# Patient Record
Sex: Female | Born: 1975 | Race: White | Hispanic: No | Marital: Married | Smoking: Never smoker
Health system: Southern US, Community
[De-identification: ages and names within clinical notes are randomized; demographics above are authoritative.]

## PROBLEM LIST (undated history)

## (undated) DIAGNOSIS — Z789 Other specified health status: Secondary | ICD-10-CM

## (undated) DIAGNOSIS — M79671 Pain in right foot: Secondary | ICD-10-CM

## (undated) DIAGNOSIS — R635 Abnormal weight gain: Secondary | ICD-10-CM

## (undated) DIAGNOSIS — I10 Essential (primary) hypertension: Secondary | ICD-10-CM

## (undated) DIAGNOSIS — R140 Abdominal distension (gaseous): Secondary | ICD-10-CM

## (undated) DIAGNOSIS — M722 Plantar fascial fibromatosis: Secondary | ICD-10-CM

## (undated) DIAGNOSIS — E669 Obesity, unspecified: Secondary | ICD-10-CM

## (undated) DIAGNOSIS — M79672 Pain in left foot: Secondary | ICD-10-CM

## (undated) HISTORY — DX: Pain in right foot: M79.672

## (undated) HISTORY — PX: APPENDECTOMY: SHX54

## (undated) HISTORY — DX: Abdominal distension (gaseous): R14.0

## (undated) HISTORY — DX: Obesity, unspecified: E66.9

## (undated) HISTORY — DX: Pain in right foot: M79.671

## (undated) HISTORY — DX: Other specified health status: Z78.9

## (undated) HISTORY — DX: Plantar fascial fibromatosis: M72.2

## (undated) HISTORY — PX: PILONIDAL CYST EXCISION: SHX744

## (undated) HISTORY — DX: Abnormal weight gain: R63.5

## (undated) HISTORY — PX: TONSILLECTOMY: SUR1361

---

## 1998-05-16 ENCOUNTER — Other Ambulatory Visit: Admission: RE | Admit: 1998-05-16 | Discharge: 1998-05-16 | Payer: Self-pay | Admitting: Family Medicine

## 1998-07-12 ENCOUNTER — Ambulatory Visit (HOSPITAL_COMMUNITY): Admission: RE | Admit: 1998-07-12 | Discharge: 1998-07-12 | Payer: Self-pay | Admitting: Family Medicine

## 1998-07-16 ENCOUNTER — Inpatient Hospital Stay (HOSPITAL_COMMUNITY): Admission: AD | Admit: 1998-07-16 | Discharge: 1998-07-16 | Payer: Self-pay | Admitting: Family Medicine

## 1998-12-04 ENCOUNTER — Inpatient Hospital Stay (HOSPITAL_COMMUNITY): Admission: AD | Admit: 1998-12-04 | Discharge: 1998-12-06 | Payer: Self-pay | Admitting: Family Medicine

## 1999-01-23 ENCOUNTER — Encounter: Admission: RE | Admit: 1999-01-23 | Discharge: 1999-02-20 | Payer: Self-pay

## 2001-08-03 ENCOUNTER — Other Ambulatory Visit: Admission: RE | Admit: 2001-08-03 | Discharge: 2001-08-03 | Payer: Self-pay | Admitting: Family Medicine

## 2001-09-28 ENCOUNTER — Encounter: Payer: Self-pay | Admitting: Family Medicine

## 2001-09-28 ENCOUNTER — Ambulatory Visit (HOSPITAL_COMMUNITY): Admission: RE | Admit: 2001-09-28 | Discharge: 2001-09-28 | Payer: Self-pay | Admitting: Family Medicine

## 2002-02-22 ENCOUNTER — Inpatient Hospital Stay (HOSPITAL_COMMUNITY): Admission: AD | Admit: 2002-02-22 | Discharge: 2002-02-23 | Payer: Self-pay | Admitting: Family Medicine

## 2002-09-27 ENCOUNTER — Other Ambulatory Visit: Admission: RE | Admit: 2002-09-27 | Discharge: 2002-09-27 | Payer: Self-pay | Admitting: Family Medicine

## 2003-10-05 ENCOUNTER — Other Ambulatory Visit: Admission: RE | Admit: 2003-10-05 | Discharge: 2003-10-05 | Payer: Self-pay | Admitting: Family Medicine

## 2004-10-17 ENCOUNTER — Other Ambulatory Visit: Admission: RE | Admit: 2004-10-17 | Discharge: 2004-10-17 | Payer: Self-pay | Admitting: Family Medicine

## 2006-01-22 ENCOUNTER — Other Ambulatory Visit: Admission: RE | Admit: 2006-01-22 | Discharge: 2006-01-22 | Payer: Self-pay | Admitting: Family Medicine

## 2007-11-02 ENCOUNTER — Other Ambulatory Visit: Admission: RE | Admit: 2007-11-02 | Discharge: 2007-11-02 | Payer: Self-pay | Admitting: Obstetrics and Gynecology

## 2007-12-14 ENCOUNTER — Ambulatory Visit (HOSPITAL_COMMUNITY): Admission: RE | Admit: 2007-12-14 | Discharge: 2007-12-14 | Payer: Self-pay | Admitting: Obstetrics and Gynecology

## 2008-01-05 ENCOUNTER — Ambulatory Visit (HOSPITAL_COMMUNITY): Admission: RE | Admit: 2008-01-05 | Discharge: 2008-01-05 | Payer: Self-pay | Admitting: Obstetrics and Gynecology

## 2008-04-29 ENCOUNTER — Ambulatory Visit: Payer: Self-pay | Admitting: Obstetrics & Gynecology

## 2008-04-29 ENCOUNTER — Inpatient Hospital Stay (HOSPITAL_COMMUNITY): Admission: AD | Admit: 2008-04-29 | Discharge: 2008-05-02 | Payer: Self-pay | Admitting: Obstetrics & Gynecology

## 2011-03-12 ENCOUNTER — Other Ambulatory Visit: Payer: Self-pay | Admitting: Adult Health

## 2011-03-12 ENCOUNTER — Other Ambulatory Visit (HOSPITAL_COMMUNITY)
Admission: RE | Admit: 2011-03-12 | Discharge: 2011-03-12 | Disposition: A | Discharge status: Home / Self Care | Payer: Self-pay | Source: Ambulatory Visit | Attending: Obstetrics and Gynecology | Admitting: Obstetrics and Gynecology

## 2011-03-12 DIAGNOSIS — Z01419 Encounter for gynecological examination (general) (routine) without abnormal findings: Secondary | ICD-10-CM | POA: Insufficient documentation

## 2011-04-07 LAB — CBC
HCT: 41
Hemoglobin: 13.6
MCHC: 33.2
MCV: 90.4
Platelets: 265
RBC: 4.53
RDW: 13.4
WBC: 11.1 — ABNORMAL HIGH

## 2011-04-07 LAB — RH IMMUNE GLOB WKUP(>/=20WKS)(NOT WOMEN'S HOSP): Fetal Screen: POSITIVE

## 2011-04-07 LAB — KLEIHAUER-BETKE STAIN
# Vials RhIg: 2
Fetal Cells %: 0.3
Quantitation Fetal Hemoglobin: 15

## 2011-04-07 LAB — RPR: RPR Ser Ql: NONREACTIVE

## 2012-06-01 ENCOUNTER — Other Ambulatory Visit: Payer: Self-pay | Admitting: Adult Health

## 2012-06-01 ENCOUNTER — Other Ambulatory Visit (HOSPITAL_COMMUNITY)
Admission: RE | Admit: 2012-06-01 | Discharge: 2012-06-01 | Disposition: A | Discharge status: Home / Self Care | Payer: PRIVATE HEALTH INSURANCE | Source: Ambulatory Visit | Attending: Obstetrics and Gynecology | Admitting: Obstetrics and Gynecology

## 2012-06-01 DIAGNOSIS — Z1151 Encounter for screening for human papillomavirus (HPV): Secondary | ICD-10-CM | POA: Insufficient documentation

## 2012-06-01 DIAGNOSIS — Z01419 Encounter for gynecological examination (general) (routine) without abnormal findings: Secondary | ICD-10-CM | POA: Insufficient documentation

## 2012-11-09 ENCOUNTER — Encounter: Payer: Self-pay | Admitting: *Deleted

## 2012-11-10 ENCOUNTER — Encounter: Payer: Self-pay | Admitting: Obstetrics & Gynecology

## 2012-11-10 ENCOUNTER — Ambulatory Visit (INDEPENDENT_AMBULATORY_CARE_PROVIDER_SITE_OTHER): Payer: PRIVATE HEALTH INSURANCE | Admitting: Obstetrics & Gynecology

## 2012-11-10 VITALS — BP 108/78 | Ht 66.0 in | Wt 232.0 lb

## 2012-11-10 DIAGNOSIS — Z3202 Encounter for pregnancy test, result negative: Secondary | ICD-10-CM

## 2012-11-10 DIAGNOSIS — Z309 Encounter for contraceptive management, unspecified: Secondary | ICD-10-CM

## 2012-11-10 DIAGNOSIS — Z3049 Encounter for surveillance of other contraceptives: Secondary | ICD-10-CM

## 2012-11-10 DIAGNOSIS — Z3200 Encounter for pregnancy test, result unknown: Secondary | ICD-10-CM

## 2012-11-10 MED ORDER — MEDROXYPROGESTERONE ACETATE 150 MG/ML IM SUSP: 150.0000 mg | Freq: Once | INTRAMUSCULAR | Status: DC

## 2012-11-10 MED ORDER — MEDROXYPROGESTERONE ACETATE 150 MG/ML IM SUSP
150.0000 mg | Freq: Once | INTRAMUSCULAR | Status: AC
  Administered 2012-11-10: 150 mg via INTRAMUSCULAR

## 2013-02-01 ENCOUNTER — Telehealth: Payer: Self-pay | Admitting: *Deleted

## 2013-02-01 ENCOUNTER — Other Ambulatory Visit: Payer: Self-pay | Admitting: Adult Health

## 2013-02-01 MED ORDER — MEDROXYPROGESTERONE ACETATE 150 MG/ML IM SUSP: 150.0000 mg | INTRAMUSCULAR | Status: DC

## 2013-02-02 ENCOUNTER — Ambulatory Visit (INDEPENDENT_AMBULATORY_CARE_PROVIDER_SITE_OTHER): Payer: PRIVATE HEALTH INSURANCE | Admitting: Women's Health

## 2013-02-02 ENCOUNTER — Encounter: Payer: Self-pay | Admitting: Women's Health

## 2013-02-02 VITALS — BP 108/78 | Ht 65.0 in | Wt 235.0 lb

## 2013-02-02 DIAGNOSIS — Z3202 Encounter for pregnancy test, result negative: Secondary | ICD-10-CM

## 2013-02-02 DIAGNOSIS — Z3049 Encounter for surveillance of other contraceptives: Secondary | ICD-10-CM

## 2013-02-02 DIAGNOSIS — Z309 Encounter for contraceptive management, unspecified: Secondary | ICD-10-CM

## 2013-02-02 MED ORDER — MEDROXYPROGESTERONE ACETATE 150 MG/ML IM SUSP
150.0000 mg | Freq: Once | INTRAMUSCULAR | Status: AC
  Administered 2013-02-02: 150 mg via INTRAMUSCULAR

## 2013-02-02 NOTE — Telephone Encounter (Signed)
Victorino Dike already took care of this rx.

## 2013-04-27 ENCOUNTER — Ambulatory Visit (INDEPENDENT_AMBULATORY_CARE_PROVIDER_SITE_OTHER): Payer: PRIVATE HEALTH INSURANCE | Admitting: Adult Health

## 2013-04-27 ENCOUNTER — Ambulatory Visit: Payer: PRIVATE HEALTH INSURANCE

## 2013-04-27 ENCOUNTER — Encounter: Payer: Self-pay | Admitting: Adult Health

## 2013-04-27 VITALS — BP 102/64 | Ht 66.0 in | Wt 236.0 lb

## 2013-04-27 DIAGNOSIS — Z309 Encounter for contraceptive management, unspecified: Secondary | ICD-10-CM

## 2013-04-27 DIAGNOSIS — Z3049 Encounter for surveillance of other contraceptives: Secondary | ICD-10-CM

## 2013-04-27 DIAGNOSIS — Z3202 Encounter for pregnancy test, result negative: Secondary | ICD-10-CM

## 2013-04-27 MED ORDER — MEDROXYPROGESTERONE ACETATE 150 MG/ML IM SUSP
150.0000 mg | Freq: Once | INTRAMUSCULAR | Status: AC
  Administered 2013-04-27: 150 mg via INTRAMUSCULAR

## 2013-06-08 ENCOUNTER — Encounter: Payer: Self-pay | Admitting: Adult Health

## 2013-06-08 ENCOUNTER — Encounter (INDEPENDENT_AMBULATORY_CARE_PROVIDER_SITE_OTHER): Payer: Self-pay

## 2013-06-08 ENCOUNTER — Ambulatory Visit (INDEPENDENT_AMBULATORY_CARE_PROVIDER_SITE_OTHER): Payer: PRIVATE HEALTH INSURANCE | Admitting: Adult Health

## 2013-06-08 VITALS — BP 110/74 | HR 74 | Ht 63.5 in | Wt 220.0 lb

## 2013-06-08 DIAGNOSIS — M79671 Pain in right foot: Secondary | ICD-10-CM

## 2013-06-08 DIAGNOSIS — Z01419 Encounter for gynecological examination (general) (routine) without abnormal findings: Secondary | ICD-10-CM

## 2013-06-08 DIAGNOSIS — E669 Obesity, unspecified: Secondary | ICD-10-CM

## 2013-06-08 DIAGNOSIS — Z309 Encounter for contraceptive management, unspecified: Secondary | ICD-10-CM

## 2013-06-08 HISTORY — DX: Obesity, unspecified: E66.9

## 2013-06-08 MED ORDER — MEDROXYPROGESTERONE ACETATE 150 MG/ML IM SUSP: 150.0000 mg | INTRAMUSCULAR | Status: DC

## 2013-06-08 NOTE — Patient Instructions (Addendum)
Physical in 1 year Mammogram at 42  Calorie Counting Diet A calorie counting diet requires you to eat the number of calories that are right for you in a day. Calories are the measurement of how much energy you get from the food you eat. Eating the right amount of calories is important for staying at a healthy weight. If you eat too many calories, your body will store them as fat and you may gain weight. If you eat too few calories, you may lose weight. Counting the number of calories you eat during a day will help you know if you are eating the right amount. A Registered Dietitian can determine how many calories you need in a day. The amount of calories needed varies from person to person. If your goal is to lose weight, you will need to eat fewer calories. Losing weight can benefit you if you are overweight or have health problems such as heart disease, high blood pressure, or diabetes. If your goal is to gain weight, you will need to eat more calories. Gaining weight may be necessary if you have a certain health problem that causes your body to need more energy. TIPS Whether you are increasing or decreasing the number of calories you eat during a day, it may be hard to get used to changes in what you eat and drink. The following are tips to help you keep track of the number of calories you eat.  Measure foods at home with measuring cups. This helps you know the amount of food and number of calories you are eating.  Restaurants often serve food in amounts that are larger than 1 serving. While eating out, estimate how many servings of a food you are given. For example, a serving of cooked rice is  cup or about the size of half of a fist. Knowing serving sizes will help you be aware of how much food you are eating at restaurants.  Ask for smaller portion sizes or child-size portions at restaurants.  Plan to eat half of a meal at a restaurant. Take the rest home or share the other half with a  friend.  Read the Nutrition Facts panel on food labels for calorie content and serving size. You can find out how many servings are in a package, the size of a serving, and the number of calories each serving has.  For example, a package might contain 3 cookies. The Nutrition Facts panel on that package says that 1 serving is 1 cookie. Below that, it will say there are 3 servings in the container. The calories section of the Nutrition Facts label says there are 90 calories. This means there are 90 calories in 1 cookie (1 serving). If you eat 1 cookie you have eaten 90 calories. If you eat all 3 cookies, you have eaten 270 calories (3 servings x 90 calories = 270 calories). The list below tells you how big or small some common portion sizes are.  1 oz.........4 stacked dice.  3 oz.........Deck of cards.  1 tsp........Tip of little finger.  1 tbs........Thumb.  2 tbs........Golf ball.   cup.......Half of a fist.  1 cup........A fist. KEEP A FOOD LOG Write down every food item you eat, the amount you eat, and the number of calories in each food you eat during the day. At the end of the day, you can add up the total number of calories you have eaten. It may help to keep a list like the one below.   Find out the calorie information by reading the Nutrition Facts panel on food labels. Breakfast  Bran cereal (1 cup, 110 calories).  Fat-free milk ( cup, 45 calories). Snack  Apple (1 medium, 80 calories). Lunch  Spinach (1 cup, 20 calories).  Tomato ( medium, 20 calories).  Chicken breast strips (3 oz, 165 calories).  Shredded cheddar cheese ( cup, 110 calories).  Light Italian dressing (2 tbs, 60 calories).  Whole-wheat bread (1 slice, 80 calories).  Tub margarine (1 tsp, 35 calories).  Vegetable soup (1 cup, 160 calories). Dinner  Pork chop (3 oz, 190 calories).  Brown rice (1 cup, 215 calories).  Steamed broccoli ( cup, 20 calories).  Strawberries (1  cup, 65  calories).  Whipped cream (1 tbs, 50 calories). Daily Calorie Total: 1425 Document Released: 06/23/2005 Document Revised: 09/15/2011 Document Reviewed: 12/18/2006 ExitCare Patient Information 2014 [employer].  

## 2013-06-08 NOTE — Progress Notes (Signed)
Patient ID: Meghan Ruiz, female   DOB: 01-31-1976, 37 y.o.   MRN: [id number] History of Present Illness: Meghan Ruiz is a 37 year old white female,married in for a physical.She had a normal pap with negative HPV 06/01/12.   Current Medications, Allergies, Past Medical History, Past Surgical History, Family History and Social History were reviewed in [employer] record.     Review of Systems: Patient denies any headaches, blurred vision, shortness of breath, chest pain, abdominal pain, problems with bowel movements, urination, or intercourse. No mood changes, she does have bilateral foot pain due to heel spurs, and has had injection in past. She is going to bariatric clinic to lose weight and has lost 15 lbs. She is happy with the Depo.   Physical Exam:BP 110/74  Pulse 74  Ht 5' 3.5" (1.613 m)  Wt 220 lb (99.791 kg)  BMI 38.36 kg/m2 General:  Well developed, well nourished, no acute distress Skin:  Warm and dry Neck:  Midline trachea, normal thyroid Lungs; Clear to auscultation bilaterally Breast:  No dominant palpable mass, retraction, or nipple discharge Cardiovascular: Regular rate and rhythm Abdomen:  Soft, non tender, no hepatosplenomegaly Pelvic:  External genitalia is normal in appearance.  The vagina is normal in appearance.  The cervix is bulbous.  Uterus is felt to be normal size, shape, and contour.  No   adnexal masses or tenderness noted. Extremities:  No swelling or varicosities noted Psych:  No mood changes, alert and cooperative ,seems happy   Impression: Yearly gyn exam no pap Contraceptive management Obesity Bilateral foot pain due to heel spurs   Plan: Physical in 1 year Mammogram at 42  Refilled depo x 1 year Follow up for depo as scheduled Review handout on weight loss

## 2013-07-20 ENCOUNTER — Ambulatory Visit (INDEPENDENT_AMBULATORY_CARE_PROVIDER_SITE_OTHER): Payer: PRIVATE HEALTH INSURANCE | Admitting: Adult Health

## 2013-07-20 ENCOUNTER — Encounter (INDEPENDENT_AMBULATORY_CARE_PROVIDER_SITE_OTHER): Payer: Self-pay

## 2013-07-20 ENCOUNTER — Encounter: Payer: Self-pay | Admitting: Adult Health

## 2013-07-20 VITALS — BP 110/78 | Ht 65.0 in | Wt 212.0 lb

## 2013-07-20 DIAGNOSIS — Z3202 Encounter for pregnancy test, result negative: Secondary | ICD-10-CM

## 2013-07-20 DIAGNOSIS — Z3049 Encounter for surveillance of other contraceptives: Secondary | ICD-10-CM

## 2013-07-20 DIAGNOSIS — Z309 Encounter for contraceptive management, unspecified: Secondary | ICD-10-CM

## 2013-07-20 LAB — POCT URINE PREGNANCY: PREG TEST UR: NEGATIVE

## 2013-07-20 MED ORDER — MEDROXYPROGESTERONE ACETATE 150 MG/ML IM SUSP
150.0000 mg | Freq: Once | INTRAMUSCULAR | Status: AC
  Administered 2013-07-20: 150 mg via INTRAMUSCULAR

## 2013-07-20 NOTE — Progress Notes (Signed)
Patient ID: Liborio NixonChristy Kesselman, female   DOB: Jan 17, 1976, 38 y.o.   MRN: [id number] Depo provera 150 mg given right deltoid, no complications. Resulted negative pregnancy test. Pt to return in 12 weeks for next injection.

## 2013-10-12 ENCOUNTER — Encounter: Payer: Self-pay | Admitting: Obstetrics and Gynecology

## 2013-10-12 ENCOUNTER — Ambulatory Visit (INDEPENDENT_AMBULATORY_CARE_PROVIDER_SITE_OTHER): Payer: PRIVATE HEALTH INSURANCE | Admitting: Obstetrics and Gynecology

## 2013-10-12 VITALS — BP 102/78 | Ht 64.0 in | Wt 206.0 lb

## 2013-10-12 DIAGNOSIS — Z3202 Encounter for pregnancy test, result negative: Secondary | ICD-10-CM

## 2013-10-12 DIAGNOSIS — Z3049 Encounter for surveillance of other contraceptives: Secondary | ICD-10-CM

## 2013-10-12 DIAGNOSIS — Z309 Encounter for contraceptive management, unspecified: Secondary | ICD-10-CM

## 2013-10-12 LAB — POCT URINE PREGNANCY: PREG TEST UR: NEGATIVE

## 2013-10-12 MED ORDER — MEDROXYPROGESTERONE ACETATE 150 MG/ML IM SUSP
150.0000 mg | Freq: Once | INTRAMUSCULAR | Status: AC
  Administered 2013-10-12: 150 mg via INTRAMUSCULAR

## 2013-10-12 NOTE — Progress Notes (Signed)
Patient ID: Meghan NixonChristy Ingles, female   DOB: 1975-11-13, 38 y.o.   MRN: [id number] Pt here today for Depo Provera injection, pregnancy test resulted negative.  Injection given Lt deltoid IM.  Pt instructed to return in 12 weeks for next injection.

## 2014-01-04 ENCOUNTER — Ambulatory Visit (INDEPENDENT_AMBULATORY_CARE_PROVIDER_SITE_OTHER): Payer: PRIVATE HEALTH INSURANCE | Admitting: Adult Health

## 2014-01-04 ENCOUNTER — Encounter: Payer: Self-pay | Admitting: Adult Health

## 2014-01-04 DIAGNOSIS — Z3049 Encounter for surveillance of other contraceptives: Secondary | ICD-10-CM

## 2014-01-04 DIAGNOSIS — Z3202 Encounter for pregnancy test, result negative: Secondary | ICD-10-CM

## 2014-01-04 DIAGNOSIS — Z3042 Encounter for surveillance of injectable contraceptive: Secondary | ICD-10-CM

## 2014-01-04 LAB — POCT URINE PREGNANCY: Preg Test, Ur: NEGATIVE

## 2014-01-04 MED ORDER — MEDROXYPROGESTERONE ACETATE 150 MG/ML IM SUSP
150.0000 mg | Freq: Once | INTRAMUSCULAR | Status: AC
  Administered 2014-01-04: 150 mg via INTRAMUSCULAR

## 2014-03-28 ENCOUNTER — Ambulatory Visit (INDEPENDENT_AMBULATORY_CARE_PROVIDER_SITE_OTHER): Payer: PRIVATE HEALTH INSURANCE | Admitting: Adult Health

## 2014-03-28 ENCOUNTER — Encounter: Payer: Self-pay | Admitting: Adult Health

## 2014-03-28 DIAGNOSIS — Z3042 Encounter for surveillance of injectable contraceptive: Secondary | ICD-10-CM

## 2014-03-28 DIAGNOSIS — Z3202 Encounter for pregnancy test, result negative: Secondary | ICD-10-CM

## 2014-03-28 DIAGNOSIS — Z3049 Encounter for surveillance of other contraceptives: Secondary | ICD-10-CM

## 2014-03-28 LAB — POCT URINE PREGNANCY: Preg Test, Ur: NEGATIVE

## 2014-03-28 MED ORDER — MEDROXYPROGESTERONE ACETATE 150 MG/ML IM SUSP
150.0000 mg | Freq: Once | INTRAMUSCULAR | Status: AC
  Administered 2014-03-28: 150 mg via INTRAMUSCULAR

## 2014-03-29 ENCOUNTER — Ambulatory Visit: Payer: PRIVATE HEALTH INSURANCE

## 2014-05-08 ENCOUNTER — Encounter: Payer: Self-pay | Admitting: Adult Health

## 2014-06-14 ENCOUNTER — Encounter: Payer: Self-pay | Admitting: Adult Health

## 2014-06-14 ENCOUNTER — Ambulatory Visit (INDEPENDENT_AMBULATORY_CARE_PROVIDER_SITE_OTHER): Payer: PRIVATE HEALTH INSURANCE | Admitting: Adult Health

## 2014-06-14 VITALS — BP 110/80 | HR 76 | Ht 64.25 in | Wt 237.0 lb

## 2014-06-14 DIAGNOSIS — R635 Abnormal weight gain: Secondary | ICD-10-CM

## 2014-06-14 DIAGNOSIS — Z01419 Encounter for gynecological examination (general) (routine) without abnormal findings: Secondary | ICD-10-CM

## 2014-06-14 HISTORY — DX: Abnormal weight gain: R63.5

## 2014-06-14 LAB — CBC
HCT: 42.1 % (ref 36.0–46.0)
Hemoglobin: 14.3 g/dL (ref 12.0–15.0)
MCH: 28.1 pg (ref 26.0–34.0)
MCHC: 34 g/dL (ref 30.0–36.0)
MCV: 82.9 fL (ref 78.0–100.0)
MPV: 9.6 fL (ref 9.4–12.4)
PLATELETS: 226 10*3/uL (ref 150–400)
RBC: 5.08 MIL/uL (ref 3.87–5.11)
RDW: 12.6 % (ref 11.5–15.5)
WBC: 5.7 10*3/uL (ref 4.0–10.5)

## 2014-06-14 LAB — TSH: TSH: 1.511 u[IU]/mL (ref 0.350–4.500)

## 2014-06-14 NOTE — Patient Instructions (Signed)
Levonorgestrel intrauterine device (IUD) What is this medicine? LEVONORGESTREL IUD (LEE voe nor jes trel) is a contraceptive (birth control) device. The device is placed inside the uterus by a healthcare professional. It is used to prevent pregnancy and can also be used to treat heavy bleeding that occurs during your period. Depending on the device, it can be used for 3 to 5 years. This medicine may be used for other purposes; ask your health care provider or pharmacist if you have questions. COMMON BRAND NAME(S): LILETTA, Mirena, Skyla What should I tell my health care provider before I take this medicine? They need to know if you have any of these conditions: -abnormal Pap smear -cancer of the breast, uterus, or cervix -diabetes -endometritis -genital or pelvic infection now or in the past -have more than one sexual partner or your partner has more than one partner -heart disease -history of an ectopic or tubal pregnancy -immune system problems -IUD in place -liver disease or tumor -problems with blood clots or take blood-thinners -use intravenous drugs -uterus of unusual shape -vaginal bleeding that has not been explained -an unusual or allergic reaction to levonorgestrel, other hormones, silicone, or polyethylene, medicines, foods, dyes, or preservatives -pregnant or trying to get pregnant -breast-feeding How should I use this medicine? This device is placed inside the uterus by a health care professional. Talk to your pediatrician regarding the use of this medicine in children. Special care may be needed. Overdosage: If you think you have taken too much of this medicine contact a poison control center or emergency room at once. NOTE: This medicine is only for you. Do not share this medicine with others. What if I miss a dose? This does not apply. What may interact with this medicine? Do not take this medicine with any of the following  medications: -amprenavir -bosentan -fosamprenavir This medicine may also interact with the following medications: -aprepitant -barbiturate medicines for inducing sleep or treating seizures -bexarotene -griseofulvin -medicines to treat seizures like carbamazepine, ethotoin, felbamate, oxcarbazepine, phenytoin, topiramate -modafinil -pioglitazone -rifabutin -rifampin -rifapentine -some medicines to treat HIV infection like atazanavir, indinavir, lopinavir, nelfinavir, tipranavir, ritonavir -St. John's wort -warfarin This list may not describe all possible interactions. Give your health care provider a list of all the medicines, herbs, non-prescription drugs, or dietary supplements you use. Also tell them if you smoke, drink alcohol, or use illegal drugs. Some items may interact with your medicine. What should I watch for while using this medicine? Visit your doctor or health care professional for regular check ups. See your doctor if you or your partner has sexual contact with others, becomes HIV positive, or gets a sexual transmitted disease. This product does not protect you against HIV infection (AIDS) or other sexually transmitted diseases. You can check the placement of the IUD yourself by reaching up to the top of your vagina with clean fingers to feel the threads. Do not pull on the threads. It is a good habit to check placement after each menstrual period. Call your doctor right away if you feel more of the IUD than just the threads or if you cannot feel the threads at all. The IUD may come out by itself. You may become pregnant if the device comes out. If you notice that the IUD has come out use a backup birth control method like condoms and call your health care provider. Using tampons will not change the position of the IUD and are okay to use during your period. What side effects may   I notice from receiving this medicine? Side effects that you should report to your doctor or  health care professional as soon as possible: -allergic reactions like skin rash, itching or hives, swelling of the face, lips, or tongue -fever, flu-like symptoms -genital sores -high blood pressure -no menstrual period for 6 weeks during use -pain, swelling, warmth in the leg -pelvic pain or tenderness -severe or sudden headache -signs of pregnancy -stomach cramping -sudden shortness of breath -trouble with balance, talking, or walking -unusual vaginal bleeding, discharge -yellowing of the eyes or skin Side effects that usually do not require medical attention (report to your doctor or health care professional if they continue or are bothersome): -acne -breast pain -change in sex drive or performance -changes in weight -cramping, dizziness, or faintness while the device is being inserted -headache -irregular menstrual bleeding within first 3 to 6 months of use -nausea This list may not describe all possible side effects. Call your doctor for medical advice about side effects. You may report side effects to FDA at 1-800-FDA-1088. Where should I keep my medicine? This does not apply. NOTE: This sheet is a summary. It may not cover all possible information. If you have questions about this medicine, talk to your doctor, pharmacist, or health care provider.  2015, Elsevier/Gold Standard. (2011-07-24 13:54:04) Pap and physical in 1 year Mammogram at 42  Return next week for depo

## 2014-06-14 NOTE — Progress Notes (Signed)
Patient ID: Meghan Ruiz, female   DOB: 08-12-1975, 38 y.o.   MRN: [id number] History of Present Illness: Meghan BonitoChristy is a 38 year old white female, married in for gyn exam and complains of weight gain.She had a normal pap with negative HPV 06/01/12.She has gained about 30 lbs this year. She is on depo and thinking about IUD.   Current Medications, Allergies, Past Medical History, Past Surgical History, Family History and Social History were reviewed in [facility] electronic medical record.     Review of Systems: Patient denies any headaches, blurred vision, shortness of breath, chest pain, abdominal pain, problems with bowel movements, urination, or intercourse. No joint pain or swelling or mood swings.See HPI.    Physical Exam:BP 110/80 mmHg  Pulse 76  Ht 5' 4.25" (1.632 m)  Wt 237 lb (107.502 kg)  BMI 40.36 kg/m2 General:  Well developed, well nourished, no acute distress Skin:  Warm and dry, has increased hair growth Neck:  Midline trachea, normal thyroid Lungs; Clear to auscultation bilaterally Breast:  No dominant palpable mass, retraction, or nipple discharge Cardiovascular: Regular rate and rhythm Abdomen:  Soft, non tender, no hepatosplenomegaly Pelvic:  External genitalia is normal in appearance.  The vagina is normal in appearance.   The cervix is bulbous.  Uterus is felt to be normal size, shape, and contour.  No  adnexal masses or tenderness noted. Extremities:  No swelling or varicosities noted Psych:  No mood changes,alert and cooperative,seems happy   Impression: Well woman gyn exam no pap Weight gain    Plan: Check CBC,CMP,TSH and lipids, will talk when labs back Pap and physical in 1 year Mammogram at 42 Return next week for depo as scheduled Review handout on IUD and talk with husband

## 2014-06-15 LAB — COMPREHENSIVE METABOLIC PANEL
ALT: 25 U/L (ref 0–35)
AST: 17 U/L (ref 0–37)
Albumin: 4.2 g/dL (ref 3.5–5.2)
Alkaline Phosphatase: 61 U/L (ref 39–117)
BUN: 12 mg/dL (ref 6–23)
CALCIUM: 9.2 mg/dL (ref 8.4–10.5)
CHLORIDE: 105 meq/L (ref 96–112)
CO2: 24 mEq/L (ref 19–32)
CREATININE: 0.68 mg/dL (ref 0.50–1.10)
Glucose, Bld: 92 mg/dL (ref 70–99)
Potassium: 3.8 mEq/L (ref 3.5–5.3)
Sodium: 138 mEq/L (ref 135–145)
Total Bilirubin: 1.6 mg/dL — ABNORMAL HIGH (ref 0.2–1.2)
Total Protein: 6.5 g/dL (ref 6.0–8.3)

## 2014-06-15 LAB — LIPID PANEL
Cholesterol: 138 mg/dL (ref 0–200)
HDL: 43 mg/dL (ref >39)
LDL CALC: 78 mg/dL (ref 0–99)
TRIGLYCERIDES: 86 mg/dL (ref <150)
Total CHOL/HDL Ratio: 3.2 Ratio
VLDL: 17 mg/dL (ref 0–40)

## 2014-06-19 ENCOUNTER — Other Ambulatory Visit: Payer: Self-pay | Admitting: Family Medicine

## 2014-06-19 NOTE — Telephone Encounter (Signed)
Pt aware labs good 

## 2014-06-20 ENCOUNTER — Ambulatory Visit (INDEPENDENT_AMBULATORY_CARE_PROVIDER_SITE_OTHER): Payer: PRIVATE HEALTH INSURANCE | Admitting: Adult Health

## 2014-06-20 ENCOUNTER — Encounter: Payer: Self-pay | Admitting: Adult Health

## 2014-06-20 DIAGNOSIS — Z3042 Encounter for surveillance of injectable contraceptive: Secondary | ICD-10-CM

## 2014-06-20 DIAGNOSIS — Z3202 Encounter for pregnancy test, result negative: Secondary | ICD-10-CM

## 2014-06-20 LAB — POCT URINE PREGNANCY: Preg Test, Ur: NEGATIVE

## 2014-06-20 MED ORDER — MEDROXYPROGESTERONE ACETATE 150 MG/ML IM SUSP
150.0000 mg | Freq: Once | INTRAMUSCULAR | Status: AC
  Administered 2014-06-20: 150 mg via INTRAMUSCULAR

## 2014-09-11 ENCOUNTER — Other Ambulatory Visit: Payer: Self-pay | Admitting: *Deleted

## 2014-09-11 MED ORDER — MEDROXYPROGESTERONE ACETATE 150 MG/ML IM SUSP: 150.0000 mg | INTRAMUSCULAR | Status: DC

## 2014-09-12 ENCOUNTER — Encounter: Payer: Self-pay | Admitting: *Deleted

## 2014-09-12 ENCOUNTER — Ambulatory Visit (INDEPENDENT_AMBULATORY_CARE_PROVIDER_SITE_OTHER): Payer: PRIVATE HEALTH INSURANCE | Admitting: *Deleted

## 2014-09-12 DIAGNOSIS — Z3042 Encounter for surveillance of injectable contraceptive: Secondary | ICD-10-CM

## 2014-09-12 DIAGNOSIS — Z3202 Encounter for pregnancy test, result negative: Secondary | ICD-10-CM

## 2014-09-12 LAB — POCT URINE PREGNANCY: Preg Test, Ur: NEGATIVE

## 2014-09-12 MED ORDER — MEDROXYPROGESTERONE ACETATE 150 MG/ML IM SUSP
150.0000 mg | Freq: Once | INTRAMUSCULAR | Status: AC
  Administered 2014-09-12: 150 mg via INTRAMUSCULAR

## 2014-09-12 NOTE — Progress Notes (Signed)
Pt here for Depo. Reports having a head cold at this time. Has tried Mucinex in the past, will try again. Advised to let us know if no better. Pt voiced understanding.  Return in 12 weeks for next shot. JSY

## 2014-12-05 ENCOUNTER — Encounter: Payer: Self-pay | Admitting: *Deleted

## 2014-12-05 ENCOUNTER — Ambulatory Visit (INDEPENDENT_AMBULATORY_CARE_PROVIDER_SITE_OTHER): Payer: BLUE CROSS/BLUE SHIELD | Admitting: *Deleted

## 2014-12-05 DIAGNOSIS — Z3202 Encounter for pregnancy test, result negative: Secondary | ICD-10-CM

## 2014-12-05 DIAGNOSIS — Z3042 Encounter for surveillance of injectable contraceptive: Secondary | ICD-10-CM

## 2014-12-05 LAB — POCT URINE PREGNANCY: PREG TEST UR: NEGATIVE

## 2014-12-05 MED ORDER — MEDROXYPROGESTERONE ACETATE 150 MG/ML IM SUSP
150.0000 mg | Freq: Once | INTRAMUSCULAR | Status: AC
  Administered 2014-12-05: 150 mg via INTRAMUSCULAR

## 2014-12-05 NOTE — Progress Notes (Signed)
Pt here for Depo. Reports no problems at this time. Return in 12 weeks for next shot. JSY 

## 2015-02-23 ENCOUNTER — Ambulatory Visit (INDEPENDENT_AMBULATORY_CARE_PROVIDER_SITE_OTHER): Payer: BLUE CROSS/BLUE SHIELD | Admitting: *Deleted

## 2015-02-23 ENCOUNTER — Encounter: Payer: Self-pay | Admitting: *Deleted

## 2015-02-23 DIAGNOSIS — Z3042 Encounter for surveillance of injectable contraceptive: Secondary | ICD-10-CM | POA: Diagnosis not present

## 2015-02-23 DIAGNOSIS — Z3202 Encounter for pregnancy test, result negative: Secondary | ICD-10-CM | POA: Diagnosis not present

## 2015-02-23 LAB — POCT URINE PREGNANCY: Preg Test, Ur: NEGATIVE

## 2015-02-23 MED ORDER — MEDROXYPROGESTERONE ACETATE 150 MG/ML IM SUSP
150.0000 mg | Freq: Once | INTRAMUSCULAR | Status: AC
  Administered 2015-02-23: 150 mg via INTRAMUSCULAR

## 2015-02-23 NOTE — Progress Notes (Signed)
Pt here for Depo. Reports no problems at this time. Return in 12 weeks for next shot. JSY 

## 2015-02-27 ENCOUNTER — Ambulatory Visit: Payer: BLUE CROSS/BLUE SHIELD

## 2015-05-21 ENCOUNTER — Encounter: Payer: Self-pay | Admitting: *Deleted

## 2015-05-21 ENCOUNTER — Ambulatory Visit (INDEPENDENT_AMBULATORY_CARE_PROVIDER_SITE_OTHER): Payer: BLUE CROSS/BLUE SHIELD | Admitting: *Deleted

## 2015-05-21 DIAGNOSIS — Z3042 Encounter for surveillance of injectable contraceptive: Secondary | ICD-10-CM

## 2015-05-21 DIAGNOSIS — Z3202 Encounter for pregnancy test, result negative: Secondary | ICD-10-CM | POA: Diagnosis not present

## 2015-05-21 LAB — POCT URINE PREGNANCY: Preg Test, Ur: NEGATIVE

## 2015-05-21 MED ORDER — MEDROXYPROGESTERONE ACETATE 150 MG/ML IM SUSP
150.0000 mg | Freq: Once | INTRAMUSCULAR | Status: AC
  Administered 2015-05-21: 150 mg via INTRAMUSCULAR

## 2015-05-21 NOTE — Progress Notes (Signed)
Pt here for Depo. Reports no problems at this time. Return in 12 weeks for next shot. JSY 

## 2015-06-18 ENCOUNTER — Other Ambulatory Visit: Payer: BLUE CROSS/BLUE SHIELD | Admitting: Adult Health

## 2015-07-11 ENCOUNTER — Encounter: Payer: Self-pay | Admitting: Adult Health

## 2015-07-11 ENCOUNTER — Ambulatory Visit (INDEPENDENT_AMBULATORY_CARE_PROVIDER_SITE_OTHER): Payer: BLUE CROSS/BLUE SHIELD | Admitting: Adult Health

## 2015-07-11 ENCOUNTER — Other Ambulatory Visit (HOSPITAL_COMMUNITY)
Admission: RE | Admit: 2015-07-11 | Discharge: 2015-07-11 | Disposition: A | Discharge status: Home / Self Care | Payer: BLUE CROSS/BLUE SHIELD | Source: Ambulatory Visit | Attending: Adult Health | Admitting: Adult Health

## 2015-07-11 VITALS — BP 120/80 | HR 92 | Ht 64.0 in | Wt 243.0 lb

## 2015-07-11 DIAGNOSIS — Z01419 Encounter for gynecological examination (general) (routine) without abnormal findings: Secondary | ICD-10-CM | POA: Diagnosis not present

## 2015-07-11 DIAGNOSIS — Z1151 Encounter for screening for human papillomavirus (HPV): Secondary | ICD-10-CM | POA: Diagnosis not present

## 2015-07-11 DIAGNOSIS — E669 Obesity, unspecified: Secondary | ICD-10-CM

## 2015-07-11 DIAGNOSIS — R140 Abdominal distension (gaseous): Secondary | ICD-10-CM

## 2015-07-11 HISTORY — DX: Abdominal distension (gaseous): R14.0

## 2015-07-11 NOTE — Progress Notes (Signed)
Patient ID: Meghan Ruiz Camba, female   DOB: 11-14-1975, 40 y.o.   MRN: [id number] History of Present Illness: Meghan Ruiz is a 40 year old white female, married in for a well woman gyn exam and pap, she is complaining of bloating esp in evenings, no pain.She said she is going to stop depo and she if she can lose some weight, and she requests labs today.Has pain in feet at times from heel spurs.   Current Medications, Allergies, Past Medical History, Past Surgical History, Family History and Social History were reviewed in [facility] electronic medical record.     Review of Systems: Patient denies any headaches, hearing loss, fatigue, blurred vision, shortness of breath, chest pain, abdominal pain, problems with bowel movements, urination, or intercourse. No joint pain or mood swings.She HPI for positives.    Physical Exam:BP 120/80 mmHg  Pulse 92  Ht 5\' 4"  (1.626 m)  Wt 243 lb (110.224 kg)  BMI 41.69 kg/m2 General:  Well developed, well nourished, no acute distress Skin:  Warm and dry Neck:  Midline trachea, normal thyroid, good ROM, no lymphadenopathy Lungs; Clear to auscultation bilaterally Breast:  No dominant palpable mass, retraction, or nipple discharge Cardiovascular: Regular rate and rhythm Abdomen:  Soft, non tender, no hepatosplenomegaly,obese Pelvic:  External genitalia is normal in appearance, no lesions.  The vagina is normal in appearance. Urethra has no lesions or masses. The cervix is bulbous. Pap with HPV performed. Uterus is felt to be ULNS size, but has increased adipose tissue in this area, so difficult to feel.   No adnexal masses or tenderness noted.Bladder is non tender, no masses felt. Extremities/musculoskeletal:  No swelling or varicosities noted, no clubbing or cyanosis Psych:  No mood changes, alert and cooperative,seems happy   Impression: Well woman gyn exam and pap Abdominal bloating Obesity     Plan: Gyn US in 6 days to assess uterus Check  CBC,CMP,TSH and lipids Mammogram at 42  Physical in 1 year, pap in 3 if normal Will talk when US and labs back  Use condoms when stops depo

## 2015-07-11 NOTE — Patient Instructions (Signed)
Physical  in 1 year Return in 6 days for gyn US Mammogram 42  Use condoms

## 2015-07-12 LAB — CBC
Hematocrit: 43.2 % (ref 34.0–46.6)
Hemoglobin: 14.7 g/dL (ref 11.1–15.9)
MCH: 28.4 pg (ref 26.6–33.0)
MCHC: 34 g/dL (ref 31.5–35.7)
MCV: 84 fL (ref 79–97)
PLATELETS: 236 10*3/uL (ref 150–379)
RBC: 5.17 x10E6/uL (ref 3.77–5.28)
RDW: 13.3 % (ref 12.3–15.4)
WBC: 8.6 10*3/uL (ref 3.4–10.8)

## 2015-07-12 LAB — COMPREHENSIVE METABOLIC PANEL
A/G RATIO: 1.8 (ref 1.1–2.5)
ALT: 45 IU/L — AB (ref 0–32)
AST: 28 IU/L (ref 0–40)
Albumin: 4.4 g/dL (ref 3.5–5.5)
Alkaline Phosphatase: 75 IU/L (ref 39–117)
BILIRUBIN TOTAL: 1.4 mg/dL — AB (ref 0.0–1.2)
BUN/Creatinine Ratio: 14 (ref 8–20)
BUN: 11 mg/dL (ref 6–20)
CALCIUM: 9.4 mg/dL (ref 8.7–10.2)
CHLORIDE: 105 mmol/L (ref 96–106)
CO2: 21 mmol/L (ref 18–29)
Creatinine, Ser: 0.76 mg/dL (ref 0.57–1.00)
GFR calc Af Amer: 116 mL/min/{1.73_m2} (ref >59)
GFR calc non Af Amer: 101 mL/min/{1.73_m2} (ref >59)
Globulin, Total: 2.4 g/dL (ref 1.5–4.5)
Glucose: 90 mg/dL (ref 65–99)
POTASSIUM: 4 mmol/L (ref 3.5–5.2)
Sodium: 143 mmol/L (ref 134–144)
Total Protein: 6.8 g/dL (ref 6.0–8.5)

## 2015-07-12 LAB — LIPID PANEL
CHOLESTEROL TOTAL: 160 mg/dL (ref 100–199)
Chol/HDL Ratio: 3.7 ratio units (ref 0.0–4.4)
HDL: 43 mg/dL (ref >39)
LDL Calculated: 102 mg/dL — ABNORMAL HIGH (ref 0–99)
TRIGLYCERIDES: 73 mg/dL (ref 0–149)
VLDL Cholesterol Cal: 15 mg/dL (ref 5–40)

## 2015-07-12 LAB — TSH: TSH: 1.98 u[IU]/mL (ref 0.450–4.500)

## 2015-07-16 ENCOUNTER — Telehealth: Payer: Self-pay | Admitting: Adult Health

## 2015-07-16 LAB — CYTOLOGY - PAP

## 2015-07-16 NOTE — Telephone Encounter (Signed)
Pt aware labs good, and the ALT slightly elevated

## 2015-07-17 ENCOUNTER — Telehealth: Payer: Self-pay | Admitting: Adult Health

## 2015-07-17 ENCOUNTER — Ambulatory Visit (INDEPENDENT_AMBULATORY_CARE_PROVIDER_SITE_OTHER): Payer: BLUE CROSS/BLUE SHIELD

## 2015-07-17 DIAGNOSIS — R140 Abdominal distension (gaseous): Secondary | ICD-10-CM

## 2015-07-17 DIAGNOSIS — N854 Malposition of uterus: Secondary | ICD-10-CM

## 2015-07-17 NOTE — Progress Notes (Signed)
PELVIC US TA/TV: homogenous anteverted uterus,normal ov's bilat (mobile),EEC 3.596mm,no free fluid seen,lt adnexal pain during ultrasound

## 2015-07-17 NOTE — Telephone Encounter (Signed)
Pt aware US was normal 

## 2015-08-13 ENCOUNTER — Ambulatory Visit (INDEPENDENT_AMBULATORY_CARE_PROVIDER_SITE_OTHER): Payer: BLUE CROSS/BLUE SHIELD | Admitting: *Deleted

## 2015-08-13 ENCOUNTER — Encounter: Payer: Self-pay | Admitting: *Deleted

## 2015-08-13 DIAGNOSIS — Z3042 Encounter for surveillance of injectable contraceptive: Secondary | ICD-10-CM | POA: Diagnosis not present

## 2015-08-13 DIAGNOSIS — Z3202 Encounter for pregnancy test, result negative: Secondary | ICD-10-CM

## 2015-08-13 LAB — POCT URINE PREGNANCY: PREG TEST UR: NEGATIVE

## 2015-08-13 MED ORDER — MEDROXYPROGESTERONE ACETATE 150 MG/ML IM SUSP
150.0000 mg | Freq: Once | INTRAMUSCULAR | Status: AC
  Administered 2015-08-13: 150 mg via INTRAMUSCULAR

## 2015-08-13 NOTE — Progress Notes (Signed)
Patient ID: Meghan Ruiz, female   DOB: [birth year], 40 y.o.   MRN: [id number] Depo Provera 150 mg IM given left deltoid with no complications, negative pregnancy test. Pt to return in 12 weeks for next injection.

## 2015-11-05 ENCOUNTER — Encounter: Payer: Self-pay | Admitting: *Deleted

## 2015-11-05 ENCOUNTER — Ambulatory Visit (INDEPENDENT_AMBULATORY_CARE_PROVIDER_SITE_OTHER): Payer: BLUE CROSS/BLUE SHIELD | Admitting: *Deleted

## 2015-11-05 DIAGNOSIS — Z3042 Encounter for surveillance of injectable contraceptive: Secondary | ICD-10-CM

## 2015-11-05 DIAGNOSIS — Z3202 Encounter for pregnancy test, result negative: Secondary | ICD-10-CM | POA: Diagnosis not present

## 2015-11-05 LAB — POCT URINE PREGNANCY: Preg Test, Ur: NEGATIVE

## 2015-11-05 MED ORDER — MEDROXYPROGESTERONE ACETATE 150 MG/ML IM SUSP
150.0000 mg | Freq: Once | INTRAMUSCULAR | Status: AC
  Administered 2015-11-05: 150 mg via INTRAMUSCULAR

## 2015-11-05 NOTE — Progress Notes (Signed)
Pt here for Depo. Pt tolerated shot well. Return in 12 weeks for next shot. JSY 

## 2016-01-28 ENCOUNTER — Encounter: Payer: Self-pay | Admitting: *Deleted

## 2016-01-28 ENCOUNTER — Ambulatory Visit (INDEPENDENT_AMBULATORY_CARE_PROVIDER_SITE_OTHER): Payer: BLUE CROSS/BLUE SHIELD | Admitting: *Deleted

## 2016-01-28 DIAGNOSIS — Z3042 Encounter for surveillance of injectable contraceptive: Secondary | ICD-10-CM

## 2016-01-28 DIAGNOSIS — Z3202 Encounter for pregnancy test, result negative: Secondary | ICD-10-CM | POA: Diagnosis not present

## 2016-01-28 LAB — POCT URINE PREGNANCY: Preg Test, Ur: NEGATIVE

## 2016-01-28 MED ORDER — MEDROXYPROGESTERONE ACETATE 150 MG/ML IM SUSP
150.0000 mg | Freq: Once | INTRAMUSCULAR | Status: AC
  Administered 2016-01-28: 150 mg via INTRAMUSCULAR

## 2016-01-28 NOTE — Progress Notes (Signed)
Pt given DEPO injection and tolerated well.  

## 2016-04-17 ENCOUNTER — Other Ambulatory Visit: Payer: Self-pay | Admitting: Adult Health

## 2016-04-21 ENCOUNTER — Encounter: Payer: Self-pay | Admitting: *Deleted

## 2016-04-21 ENCOUNTER — Ambulatory Visit (INDEPENDENT_AMBULATORY_CARE_PROVIDER_SITE_OTHER): Payer: BLUE CROSS/BLUE SHIELD | Admitting: *Deleted

## 2016-04-21 DIAGNOSIS — Z3042 Encounter for surveillance of injectable contraceptive: Secondary | ICD-10-CM | POA: Diagnosis not present

## 2016-04-21 DIAGNOSIS — Z308 Encounter for other contraceptive management: Secondary | ICD-10-CM

## 2016-04-21 DIAGNOSIS — Z3202 Encounter for pregnancy test, result negative: Secondary | ICD-10-CM | POA: Diagnosis not present

## 2016-04-21 LAB — POCT URINE PREGNANCY: PREG TEST UR: NEGATIVE

## 2016-04-21 MED ORDER — MEDROXYPROGESTERONE ACETATE 150 MG/ML IM SUSP
150.0000 mg | Freq: Once | INTRAMUSCULAR | Status: AC
  Administered 2016-04-21: 150 mg via INTRAMUSCULAR

## 2016-04-21 NOTE — Progress Notes (Signed)
Pt here for Depo. Pt tolerated shot well. Return in 12 weeks for next shot. JSY 

## 2016-07-23 ENCOUNTER — Ambulatory Visit: Payer: BLUE CROSS/BLUE SHIELD

## 2016-07-25 ENCOUNTER — Encounter: Payer: Self-pay | Admitting: *Deleted

## 2016-07-25 ENCOUNTER — Ambulatory Visit (INDEPENDENT_AMBULATORY_CARE_PROVIDER_SITE_OTHER): Payer: BLUE CROSS/BLUE SHIELD | Admitting: *Deleted

## 2016-07-25 DIAGNOSIS — Z3202 Encounter for pregnancy test, result negative: Secondary | ICD-10-CM

## 2016-07-25 DIAGNOSIS — Z3042 Encounter for surveillance of injectable contraceptive: Secondary | ICD-10-CM | POA: Diagnosis not present

## 2016-07-25 DIAGNOSIS — Z308 Encounter for other contraceptive management: Secondary | ICD-10-CM

## 2016-07-25 LAB — POCT URINE PREGNANCY: Preg Test, Ur: NEGATIVE

## 2016-07-25 MED ORDER — MEDROXYPROGESTERONE ACETATE 150 MG/ML IM SUSP
150.0000 mg | Freq: Once | INTRAMUSCULAR | Status: AC
  Administered 2016-07-25: 150 mg via INTRAMUSCULAR

## 2016-07-25 NOTE — Progress Notes (Signed)
Pt here for Depo. Pt is 4 days late getting shot. Pt last had sex 07/21/16. I spoke with JAG and she advised ok to give shot as long as UPT is negative. UPT was negative. Pt tolerated shot well. Return in 12 weeks for next shot. JSY

## 2016-08-06 ENCOUNTER — Encounter: Payer: Self-pay | Admitting: Adult Health

## 2016-08-06 ENCOUNTER — Ambulatory Visit (INDEPENDENT_AMBULATORY_CARE_PROVIDER_SITE_OTHER): Payer: BLUE CROSS/BLUE SHIELD | Admitting: Adult Health

## 2016-08-06 VITALS — BP 126/80 | HR 78 | Ht 64.25 in | Wt 240.5 lb

## 2016-08-06 DIAGNOSIS — Z3042 Encounter for surveillance of injectable contraceptive: Secondary | ICD-10-CM

## 2016-08-06 DIAGNOSIS — R609 Edema, unspecified: Secondary | ICD-10-CM

## 2016-08-06 DIAGNOSIS — Z01419 Encounter for gynecological examination (general) (routine) without abnormal findings: Secondary | ICD-10-CM | POA: Diagnosis not present

## 2016-08-06 MED ORDER — MEDROXYPROGESTERONE ACETATE 150 MG/ML IM SUSP: INTRAMUSCULAR | 4 refills | Status: DC

## 2016-08-06 MED ORDER — HYDROCHLOROTHIAZIDE 25 MG PO TABS: ORAL_TABLET | ORAL | 1 refills | Status: DC

## 2016-08-06 NOTE — Progress Notes (Signed)
Patient ID: Meghan Ruiz, female   DOB: 1976-05-29, 41 y.o.   MRN: [id number] History of Present Illness: Meghan Ruiz is a 41 year old white female, married in for well woman gyn exam, she had a normal pap with negative HPV 07/11/15. She is happy with depo, "husband won't go get fixed".    Current Medications, Allergies, Past Medical History, Past Surgical History, Family History and Social History were reviewed in [facility] electronic medical record.     Review of Systems: Patient denies any headaches, hearing loss, fatigue, blurred vision, shortness of breath, chest pain, abdominal pain, problems with bowel movements, urination, or intercourse. No joint pain or mood swings.+swelling in fingers and legs,but works 14 hours in [employer] [place] [employer]     Physical Exam:BP 126/80 (BP Location: Left Arm, Patient Position: Sitting, Cuff Size: Large)   Pulse 78   Ht 5' 4.25" (1.632 m)   Wt 240 lb 8 oz (109.1 kg)   BMI 40.96 kg/m  General:  Well developed, well nourished, no acute distress Skin:  Warm and dry Neck:  Midline trachea, normal thyroid, good ROM, no lymphadenopathy Lungs; Clear to auscultation bilaterally Breast:  No dominant palpable mass, retraction, or nipple discharge Cardiovascular: Regular rate and rhythm Abdomen:  Soft, non tender, no hepatosplenomegaly,increased hair Pelvic:  External genitalia is normal in appearance, no lesions.  The vagina is normal in appearance. Urethra has no lesions or masses. The cervix is bulbous.  Uterus is felt to be normal size, shape, and contour.  No adnexal masses or tenderness noted.Bladder is non tender, no masses felt. Extremities/musculoskeletal: Mild swelling, not pitting, no varicosities noted, no clubbing or cyanosis Psych:  No mood changes, alert and cooperative,seems happy PHQ 2 score 0.  Impression:  1. Well woman exam with routine gynecological exam   2. Surveillance for Depo-Provera contraception   3. Swelling       Plan:  Meds ordered this encounter  Medications  . medroxyPROGESTERone (DEPO-PROVERA) 150 MG/ML injection    Sig: INJECT 1ML EVERY 3 MONTHS    Dispense:  1 mL    Refill:  4    Order Specific Question:   Supervising Provider    Answer:   Despina HiddenEURE, LUTHER H [[id number]]  . hydrochlorothiazide (HYDRODIURIL) 25 MG tablet    Sig: Take 1 daily prn swelling    Dispense:  30 tablet    Refill:  1    Order Specific Question:   Supervising Provider    Answer:   Duane LopeEURE, LUTHER H [[id number]]  Check CBC,CMP,TSH and lipids Physical in 1 year Pap in 2020 Mammogram at 42

## 2016-08-06 NOTE — Patient Instructions (Signed)
Physical in 1 year 

## 2016-08-07 LAB — COMPREHENSIVE METABOLIC PANEL
A/G RATIO: 2.2 (ref 1.2–2.2)
ALBUMIN: 4.7 g/dL (ref 3.5–5.5)
ALK PHOS: 69 IU/L (ref 39–117)
ALT: 51 IU/L — ABNORMAL HIGH (ref 0–32)
AST: 32 IU/L (ref 0–40)
BILIRUBIN TOTAL: 1.5 mg/dL — AB (ref 0.0–1.2)
BUN / CREAT RATIO: 14 (ref 9–23)
BUN: 11 mg/dL (ref 6–20)
CO2: 23 mmol/L (ref 18–29)
CREATININE: 0.77 mg/dL (ref 0.57–1.00)
Calcium: 9.2 mg/dL (ref 8.7–10.2)
Chloride: 103 mmol/L (ref 96–106)
GFR calc Af Amer: 113 mL/min/{1.73_m2} (ref >59)
GFR calc non Af Amer: 98 mL/min/{1.73_m2} (ref >59)
GLOBULIN, TOTAL: 2.1 g/dL (ref 1.5–4.5)
Glucose: 94 mg/dL (ref 65–99)
POTASSIUM: 4.5 mmol/L (ref 3.5–5.2)
Sodium: 142 mmol/L (ref 134–144)
Total Protein: 6.8 g/dL (ref 6.0–8.5)

## 2016-08-07 LAB — CBC
HEMATOCRIT: 42.4 % (ref 34.0–46.6)
HEMOGLOBIN: 14.6 g/dL (ref 11.1–15.9)
MCH: 28.8 pg (ref 26.6–33.0)
MCHC: 34.4 g/dL (ref 31.5–35.7)
MCV: 84 fL (ref 79–97)
Platelets: 244 10*3/uL (ref 150–379)
RBC: 5.07 x10E6/uL (ref 3.77–5.28)
RDW: 12.9 % (ref 12.3–15.4)
WBC: 6.7 10*3/uL (ref 3.4–10.8)

## 2016-08-07 LAB — LIPID PANEL
Chol/HDL Ratio: 3.4 ratio units (ref 0.0–4.4)
Cholesterol, Total: 158 mg/dL (ref 100–199)
HDL: 46 mg/dL (ref >39)
LDL Calculated: 93 mg/dL (ref 0–99)
Triglycerides: 97 mg/dL (ref 0–149)
VLDL CHOLESTEROL CAL: 19 mg/dL (ref 5–40)

## 2016-08-07 LAB — TSH: TSH: 2.49 u[IU]/mL (ref 0.450–4.500)

## 2016-08-11 ENCOUNTER — Telehealth: Payer: Self-pay | Admitting: Adult Health

## 2016-08-11 NOTE — Telephone Encounter (Signed)
Pt aware of labs  

## 2016-10-20 ENCOUNTER — Ambulatory Visit (INDEPENDENT_AMBULATORY_CARE_PROVIDER_SITE_OTHER): Payer: BLUE CROSS/BLUE SHIELD | Admitting: *Deleted

## 2016-10-20 DIAGNOSIS — Z3042 Encounter for surveillance of injectable contraceptive: Secondary | ICD-10-CM

## 2016-10-20 DIAGNOSIS — Z3202 Encounter for pregnancy test, result negative: Secondary | ICD-10-CM

## 2016-10-20 LAB — POCT URINE PREGNANCY: PREG TEST UR: NEGATIVE

## 2016-10-20 MED ORDER — MEDROXYPROGESTERONE ACETATE 150 MG/ML IM SUSP
150.0000 mg | Freq: Once | INTRAMUSCULAR | Status: AC
  Administered 2016-10-20: 150 mg via INTRAMUSCULAR

## 2016-10-20 NOTE — Progress Notes (Signed)
DepoProvera  given IM left deltoid without complications. Advised pt to return in 12 weeks for next depo injection. Pt verbalized understanding.

## 2017-01-12 ENCOUNTER — Encounter: Payer: Self-pay | Admitting: *Deleted

## 2017-01-12 ENCOUNTER — Ambulatory Visit (INDEPENDENT_AMBULATORY_CARE_PROVIDER_SITE_OTHER): Payer: BLUE CROSS/BLUE SHIELD | Admitting: *Deleted

## 2017-01-12 DIAGNOSIS — Z3202 Encounter for pregnancy test, result negative: Secondary | ICD-10-CM

## 2017-01-12 DIAGNOSIS — Z3042 Encounter for surveillance of injectable contraceptive: Secondary | ICD-10-CM | POA: Diagnosis not present

## 2017-01-12 LAB — POCT URINE PREGNANCY: Preg Test, Ur: NEGATIVE

## 2017-01-12 MED ORDER — MEDROXYPROGESTERONE ACETATE 150 MG/ML IM SUSP
150.0000 mg | Freq: Once | INTRAMUSCULAR | Status: AC
  Administered 2017-01-12: 150 mg via INTRAMUSCULAR

## 2017-01-12 NOTE — Progress Notes (Signed)
Pt given Depo Provera 150mg IM right deltoid without complications. Advised pt to return in 12 weeks for next injection. Pt verbalized understanding. 

## 2017-03-27 ENCOUNTER — Encounter: Payer: Self-pay | Admitting: *Deleted

## 2017-03-27 ENCOUNTER — Ambulatory Visit (INDEPENDENT_AMBULATORY_CARE_PROVIDER_SITE_OTHER): Payer: BLUE CROSS/BLUE SHIELD | Admitting: *Deleted

## 2017-03-27 VITALS — Wt 240.0 lb

## 2017-03-27 DIAGNOSIS — Z3202 Encounter for pregnancy test, result negative: Secondary | ICD-10-CM

## 2017-03-27 DIAGNOSIS — Z3042 Encounter for surveillance of injectable contraceptive: Secondary | ICD-10-CM | POA: Diagnosis not present

## 2017-03-27 LAB — POCT URINE PREGNANCY: PREG TEST UR: NEGATIVE

## 2017-03-27 MED ORDER — MEDROXYPROGESTERONE ACETATE 150 MG/ML IM SUSP
150.0000 mg | Freq: Once | INTRAMUSCULAR | Status: AC
  Administered 2017-03-27: 150 mg via INTRAMUSCULAR

## 2017-03-27 NOTE — Progress Notes (Signed)
Depo Provera 150mg IM given in right deltoid with no complications. Pt to return in 12 weeks for next injection.  

## 2017-06-19 ENCOUNTER — Ambulatory Visit (INDEPENDENT_AMBULATORY_CARE_PROVIDER_SITE_OTHER): Payer: BLUE CROSS/BLUE SHIELD

## 2017-06-19 DIAGNOSIS — Z3042 Encounter for surveillance of injectable contraceptive: Secondary | ICD-10-CM | POA: Diagnosis not present

## 2017-06-19 DIAGNOSIS — Z3202 Encounter for pregnancy test, result negative: Secondary | ICD-10-CM | POA: Diagnosis not present

## 2017-06-19 LAB — POCT URINE PREGNANCY: PREG TEST UR: NEGATIVE

## 2017-06-19 MED ORDER — MEDROXYPROGESTERONE ACETATE 150 MG/ML IM SUSP
150.0000 mg | Freq: Once | INTRAMUSCULAR | Status: AC
  Administered 2017-06-19: 150 mg via INTRAMUSCULAR

## 2017-06-19 NOTE — Progress Notes (Signed)
PT here for depo shot 150 mg IM given lt deltoid. Tolerated well. Return 12 weeks for next shot.pad CMA. 

## 2017-07-07 HISTORY — PX: TUBAL LIGATION: SHX77

## 2017-08-06 ENCOUNTER — Other Ambulatory Visit: Payer: Self-pay | Admitting: Adult Health

## 2017-08-10 ENCOUNTER — Ambulatory Visit (INDEPENDENT_AMBULATORY_CARE_PROVIDER_SITE_OTHER): Payer: BLUE CROSS/BLUE SHIELD | Admitting: Adult Health

## 2017-08-10 ENCOUNTER — Encounter: Payer: Self-pay | Admitting: Adult Health

## 2017-08-10 VITALS — BP 122/70 | HR 79 | Ht 64.25 in | Wt 241.0 lb

## 2017-08-10 DIAGNOSIS — Z3042 Encounter for surveillance of injectable contraceptive: Secondary | ICD-10-CM | POA: Insufficient documentation

## 2017-08-10 DIAGNOSIS — Z01419 Encounter for gynecological examination (general) (routine) without abnormal findings: Secondary | ICD-10-CM

## 2017-08-10 MED ORDER — HYDROCHLOROTHIAZIDE 25 MG PO TABS: ORAL_TABLET | ORAL | 1 refills | Status: DC

## 2017-08-10 MED ORDER — MEDROXYPROGESTERONE ACETATE 150 MG/ML IM SUSP: INTRAMUSCULAR | 4 refills | Status: DC

## 2017-08-10 NOTE — Progress Notes (Signed)
Patient ID: Meghan Ruiz Ruiz, female   DOB: 1976-05-27, 42 y.o.   MRN: [id number] History of Present Illness:  Meghan Ruiz is a 42 year old white female, married, on depo in for a well woman gyn exam,she had a normal pap with negative HPV 07/11/15. PCP is [facility].  Current Medications, Allergies, Past Medical History, Past Surgical History, Family History and Social History were reviewed in [facility] electronic medical record.     Review of Systems:  Patient denies any headaches, hearing loss, fatigue, blurred vision, shortness of breath, chest pain, abdominal pain, problems with bowel movements, urination, or intercourse. No joint pain or mood swings. She is happy with her depo.   Physical Exam:BP 122/70 (BP Location: Left Arm, Patient Position: Sitting, Cuff Size: Large)   Pulse 79   Ht 5' 4.25" (1.632 m)   Wt 241 lb (109.3 kg)   BMI 41.05 kg/m  General:  Well developed, well nourished, no acute distress Skin:  Warm and dry Neck:  Midline trachea, normal thyroid, good ROM, no lymphadenopathy Lungs; Clear to auscultation bilaterally Breast:  No dominant palpable mass, retraction, or nipple discharge Cardiovascular: Regular rate and rhythm Abdomen:  Soft, non tender, no hepatosplenomegaly Pelvic:  External genitalia is normal in appearance, no lesions.  The vagina is normal in appearance. Urethra has no lesions or masses. The cervix is bulbous.  Uterus is felt to be normal size, shape, and contour.  No adnexal masses or tenderness noted.Bladder is non tender, no masses felt. Extremities/musculoskeletal:  No swelling or varicosities noted, no clubbing or cyanosis Psych:  No mood changes, alert and cooperative,seems happy PHQ 2 score 0.  Impression: 1. Well woman exam with routine gynecological exam   2. Encounter for surveillance of injectable contraceptive       Plan: Meds ordered this encounter  Medications  . medroxyPROGESTERone (DEPO-PROVERA) 150 MG/ML injection    Sig: INJECT 1ML EVERY 3 MONTHS    Dispense:  1 mL    Refill:  4    Order Specific Question:   Supervising Provider    Answer:   Despina HiddenEURE, LUTHER H [[id number]]  . hydrochlorothiazide (HYDRODIURIL) 25 MG tablet    Sig: TAKE 1 TABLET DAILY AS NEEDED FOR SWELLING    Dispense:  30 tablet    Refill:  1    Order Specific Question:   Supervising Provider    Answer:   Duane LopeEURE, LUTHER H [[id number]]  Pap and physical in 1 year Depo as scheduled  Get mammogram at 42

## 2017-09-10 ENCOUNTER — Ambulatory Visit: Payer: BLUE CROSS/BLUE SHIELD

## 2017-09-11 ENCOUNTER — Ambulatory Visit (INDEPENDENT_AMBULATORY_CARE_PROVIDER_SITE_OTHER): Payer: BLUE CROSS/BLUE SHIELD

## 2017-09-11 VITALS — HR 98 | Ht 64.0 in | Wt 243.0 lb

## 2017-09-11 DIAGNOSIS — Z3202 Encounter for pregnancy test, result negative: Secondary | ICD-10-CM | POA: Diagnosis not present

## 2017-09-11 DIAGNOSIS — Z3042 Encounter for surveillance of injectable contraceptive: Secondary | ICD-10-CM

## 2017-09-11 LAB — POCT URINE PREGNANCY: Preg Test, Ur: NEGATIVE

## 2017-09-11 MED ORDER — MEDROXYPROGESTERONE ACETATE 150 MG/ML IM SUSP
150.0000 mg | Freq: Once | INTRAMUSCULAR | Status: AC
  Administered 2017-09-11: 150 mg via INTRAMUSCULAR

## 2017-09-11 NOTE — Progress Notes (Signed)
Pt here for depo injectable 150 mg IM given rt deltoid. Tolerated well. Return 12 weeks for next injection.Pad CMA 

## 2017-09-11 NOTE — Addendum Note (Signed)
Addended by: Federico FlakeNES, PEGGY A on: 09/11/2017 10:07 AM   Modules accepted: Level of Service

## 2017-12-04 ENCOUNTER — Ambulatory Visit (INDEPENDENT_AMBULATORY_CARE_PROVIDER_SITE_OTHER): Payer: BLUE CROSS/BLUE SHIELD

## 2017-12-04 VITALS — Ht 64.0 in | Wt 243.0 lb

## 2017-12-04 DIAGNOSIS — Z3202 Encounter for pregnancy test, result negative: Secondary | ICD-10-CM

## 2017-12-04 DIAGNOSIS — Z3042 Encounter for surveillance of injectable contraceptive: Secondary | ICD-10-CM

## 2017-12-04 LAB — POCT URINE PREGNANCY: PREG TEST UR: NEGATIVE

## 2017-12-04 MED ORDER — MEDROXYPROGESTERONE ACETATE 150 MG/ML IM SUSP
150.0000 mg | Freq: Once | INTRAMUSCULAR | Status: AC
  Administered 2017-12-04: 150 mg via INTRAMUSCULAR

## 2017-12-04 NOTE — Progress Notes (Signed)
Pt here for depo injection 150 mg IM given lt deltoid. Tolerated well. Return 12 week for next injection. Pad CMA 

## 2018-02-26 ENCOUNTER — Ambulatory Visit (INDEPENDENT_AMBULATORY_CARE_PROVIDER_SITE_OTHER): Payer: BLUE CROSS/BLUE SHIELD | Admitting: *Deleted

## 2018-02-26 ENCOUNTER — Encounter: Payer: Self-pay | Admitting: *Deleted

## 2018-02-26 ENCOUNTER — Other Ambulatory Visit: Payer: Self-pay

## 2018-02-26 DIAGNOSIS — Z3042 Encounter for surveillance of injectable contraceptive: Secondary | ICD-10-CM

## 2018-02-26 DIAGNOSIS — Z3202 Encounter for pregnancy test, result negative: Secondary | ICD-10-CM | POA: Diagnosis not present

## 2018-02-26 LAB — POCT URINE PREGNANCY: Preg Test, Ur: NEGATIVE

## 2018-02-26 MED ORDER — MEDROXYPROGESTERONE ACETATE 150 MG/ML IM SUSP
150.0000 mg | Freq: Once | INTRAMUSCULAR | Status: AC
  Administered 2018-02-26: 150 mg via INTRAMUSCULAR

## 2018-02-26 NOTE — Progress Notes (Signed)
Pt given DepoProvera 150mg IM right deltoid without complications. Advised to return in 12 weeks for next injection. 

## 2018-05-21 ENCOUNTER — Encounter (INDEPENDENT_AMBULATORY_CARE_PROVIDER_SITE_OTHER): Payer: Self-pay

## 2018-05-21 ENCOUNTER — Ambulatory Visit (INDEPENDENT_AMBULATORY_CARE_PROVIDER_SITE_OTHER): Payer: Managed Care, Other (non HMO)

## 2018-05-21 VITALS — Ht 65.0 in | Wt 243.2 lb

## 2018-05-21 DIAGNOSIS — Z3042 Encounter for surveillance of injectable contraceptive: Secondary | ICD-10-CM

## 2018-05-21 DIAGNOSIS — Z3202 Encounter for pregnancy test, result negative: Secondary | ICD-10-CM

## 2018-05-21 LAB — POCT URINE PREGNANCY: PREG TEST UR: NEGATIVE

## 2018-05-21 MED ORDER — MEDROXYPROGESTERONE ACETATE 150 MG/ML IM SUSP
150.0000 mg | Freq: Once | INTRAMUSCULAR | Status: AC
  Administered 2018-05-21: 150 mg via INTRAMUSCULAR

## 2018-05-21 NOTE — Progress Notes (Signed)
Pt here for depo injection 150 mg IM given rt deltoid. Tolerated well. Return 12 weeks for next injection. Pad CMA 

## 2018-08-11 ENCOUNTER — Other Ambulatory Visit: Payer: Self-pay | Admitting: Adult Health

## 2018-08-13 ENCOUNTER — Ambulatory Visit: Payer: Managed Care, Other (non HMO)

## 2018-08-13 ENCOUNTER — Other Ambulatory Visit (HOSPITAL_COMMUNITY)
Admission: RE | Admit: 2018-08-13 | Discharge: 2018-08-13 | Disposition: A | Discharge status: Home / Self Care | Payer: Managed Care, Other (non HMO) | Source: Ambulatory Visit | Attending: Adult Health | Admitting: Adult Health

## 2018-08-13 ENCOUNTER — Encounter: Payer: Self-pay | Admitting: Adult Health

## 2018-08-13 ENCOUNTER — Ambulatory Visit (INDEPENDENT_AMBULATORY_CARE_PROVIDER_SITE_OTHER): Payer: Managed Care, Other (non HMO) | Admitting: Adult Health

## 2018-08-13 ENCOUNTER — Other Ambulatory Visit: Payer: Self-pay

## 2018-08-13 VITALS — BP 110/71 | HR 73 | Ht 65.0 in | Wt 241.0 lb

## 2018-08-13 DIAGNOSIS — Z13220 Encounter for screening for lipoid disorders: Secondary | ICD-10-CM

## 2018-08-13 DIAGNOSIS — Z01419 Encounter for gynecological examination (general) (routine) without abnormal findings: Secondary | ICD-10-CM | POA: Insufficient documentation

## 2018-08-13 DIAGNOSIS — Z3042 Encounter for surveillance of injectable contraceptive: Secondary | ICD-10-CM | POA: Diagnosis not present

## 2018-08-13 DIAGNOSIS — Z1211 Encounter for screening for malignant neoplasm of colon: Secondary | ICD-10-CM | POA: Diagnosis not present

## 2018-08-13 DIAGNOSIS — Z1212 Encounter for screening for malignant neoplasm of rectum: Secondary | ICD-10-CM | POA: Diagnosis not present

## 2018-08-13 DIAGNOSIS — Z3202 Encounter for pregnancy test, result negative: Secondary | ICD-10-CM | POA: Insufficient documentation

## 2018-08-13 LAB — HEMOCCULT GUIAC POC 1CARD (OFFICE): Fecal Occult Blood, POC: NEGATIVE

## 2018-08-13 LAB — POCT URINE PREGNANCY: PREG TEST UR: NEGATIVE

## 2018-08-13 MED ORDER — MEDROXYPROGESTERONE ACETATE 150 MG/ML IM SUSP
150.0000 mg | Freq: Once | INTRAMUSCULAR | Status: AC
  Administered 2018-08-13: 150 mg via INTRAMUSCULAR

## 2018-08-13 NOTE — Progress Notes (Addendum)
Patient ID: Meghan Ruiz Lafferty, female   DOB: 1975/11/14, 43 y.o.   MRN: [id number] History of Present Illness: Meghan Ruiz is a 43 year old white female, married, G3P3 in for well woman gyn exam and pap. No PCP.    Current Medications, Allergies, Past Medical History, Past Surgical History, Family History and Social History were reviewed in [facility] electronic medical record.     Review of Systems: Patient denies any headaches, hearing loss, fatigue, blurred vision, shortness of breath, chest pain, abdominal pain, problems with bowel movements, urination, or intercourse. No joint pain or mood swings. Feet hurt, esp after working long hours     Physical Exam:BP 110/71 (BP Location: Right Arm, Patient Position: Sitting, Cuff Size: Large)   Pulse 73   Ht 5\' 5"  (1.651 m)   Wt 241 lb (109.3 kg)   BMI 40.10 kg/m UPT negative,she got depo today.  General:  Well developed, well nourished, no acute distress Skin:  Warm and dry Neck:  Midline trachea, normal thyroid, good ROM, no lymphadenopathy Lungs; Clear to auscultation bilaterally Breast:  No dominant palpable mass, retraction, or nipple discharge Cardiovascular: Regular rate and rhythm Abdomen:  Soft, non tender, no hepatosplenomegaly,has dark hair  Pelvic:  External genitalia is normal in appearance, no lesions.  The vagina is normal in appearance,black blood in vagina. Urethra has no lesions or masses. The cervix is bulbous.pap with HPV performed.  Uterus is felt to be normal size, shape, and contour.  No adnexal masses or tenderness noted.Bladder is non tender, no masses felt. Rectal: Good sphincter tone, no polyps, or hemorrhoids felt.  Hemoccult negative. Extremities/musculoskeletal:  No swelling or varicosities noted, no clubbing or cyanosis Psych:  No mood changes, alert and cooperative,seems happy Fall risk is low. PHQ 2 score 0. Examination chaperoned by Francene FindersKim Lancaster, RN.   Impression: 1. Encounter for gynecological  examination with Papanicolaou smear of cervix   2. Surveillance for Depo-Provera contraception   3. Negative pregnancy test   4. Encounter for surveillance of injectable contraceptive   5. Screening for colorectal cancer       Plan: Check CBC,CMP,TSH and lipids, she is fasting  Return in 12 weeks for depo Physical in 1 year Pap in 3 if normal Get mammogram now and yearly Has refills on Depo

## 2018-08-14 LAB — COMPREHENSIVE METABOLIC PANEL
ALT: 36 IU/L — ABNORMAL HIGH (ref 0–32)
AST: 20 IU/L (ref 0–40)
Albumin/Globulin Ratio: 2.1 (ref 1.2–2.2)
Albumin: 4.4 g/dL (ref 3.8–4.8)
Alkaline Phosphatase: 78 IU/L (ref 39–117)
BUN/Creatinine Ratio: 14 (ref 9–23)
BUN: 11 mg/dL (ref 6–24)
Bilirubin Total: 1.5 mg/dL — ABNORMAL HIGH (ref 0.0–1.2)
CO2: 21 mmol/L (ref 20–29)
Calcium: 9 mg/dL (ref 8.7–10.2)
Chloride: 103 mmol/L (ref 96–106)
Creatinine, Ser: 0.76 mg/dL (ref 0.57–1.00)
GFR calc Af Amer: 113 mL/min/{1.73_m2} (ref >59)
GFR calc non Af Amer: 98 mL/min/{1.73_m2} (ref >59)
Globulin, Total: 2.1 g/dL (ref 1.5–4.5)
Glucose: 97 mg/dL (ref 65–99)
Potassium: 4.3 mmol/L (ref 3.5–5.2)
Sodium: 142 mmol/L (ref 134–144)
Total Protein: 6.5 g/dL (ref 6.0–8.5)

## 2018-08-14 LAB — CBC
Hematocrit: 43 % (ref 34.0–46.6)
Hemoglobin: 14.8 g/dL (ref 11.1–15.9)
MCH: 28.2 pg (ref 26.6–33.0)
MCHC: 34.4 g/dL (ref 31.5–35.7)
MCV: 82 fL (ref 79–97)
Platelets: 229 10*3/uL (ref 150–450)
RBC: 5.24 x10E6/uL (ref 3.77–5.28)
RDW: 12.2 % (ref 11.7–15.4)
WBC: 6.7 10*3/uL (ref 3.4–10.8)

## 2018-08-14 LAB — LIPID PANEL
Chol/HDL Ratio: 3.3 ratio (ref 0.0–4.4)
Cholesterol, Total: 153 mg/dL (ref 100–199)
HDL: 46 mg/dL (ref >39)
LDL Calculated: 94 mg/dL (ref 0–99)
Triglycerides: 66 mg/dL (ref 0–149)
VLDL Cholesterol Cal: 13 mg/dL (ref 5–40)

## 2018-08-14 LAB — TSH: TSH: 3.2 u[IU]/mL (ref 0.450–4.500)

## 2018-08-16 LAB — CYTOLOGY - PAP
Diagnosis: NEGATIVE
HPV: NOT DETECTED

## 2018-08-18 ENCOUNTER — Telehealth: Payer: Self-pay | Admitting: Adult Health

## 2018-08-18 NOTE — Telephone Encounter (Signed)
Pt aware of labs and pap 

## 2018-11-05 ENCOUNTER — Ambulatory Visit (INDEPENDENT_AMBULATORY_CARE_PROVIDER_SITE_OTHER): Payer: Managed Care, Other (non HMO) | Admitting: *Deleted

## 2018-11-05 ENCOUNTER — Other Ambulatory Visit: Payer: Self-pay

## 2018-11-05 DIAGNOSIS — Z3042 Encounter for surveillance of injectable contraceptive: Secondary | ICD-10-CM

## 2018-11-05 MED ORDER — MEDROXYPROGESTERONE ACETATE 150 MG/ML IM SUSP
150.0000 mg | Freq: Once | INTRAMUSCULAR | Status: AC
  Administered 2018-11-05: 09:00:00 150 mg via INTRAMUSCULAR

## 2018-11-05 NOTE — Progress Notes (Signed)
Depo Provera 150 mg given IM in right deltoid.

## 2019-01-27 ENCOUNTER — Telehealth: Payer: Self-pay | Admitting: Obstetrics and Gynecology

## 2019-01-27 NOTE — Telephone Encounter (Signed)

## 2019-01-28 ENCOUNTER — Ambulatory Visit (INDEPENDENT_AMBULATORY_CARE_PROVIDER_SITE_OTHER): Payer: Managed Care, Other (non HMO) | Admitting: *Deleted

## 2019-01-28 ENCOUNTER — Other Ambulatory Visit: Payer: Self-pay

## 2019-01-28 DIAGNOSIS — Z3042 Encounter for surveillance of injectable contraceptive: Secondary | ICD-10-CM

## 2019-01-28 DIAGNOSIS — Z308 Encounter for other contraceptive management: Secondary | ICD-10-CM

## 2019-01-28 DIAGNOSIS — Z3202 Encounter for pregnancy test, result negative: Secondary | ICD-10-CM

## 2019-01-28 LAB — POCT URINE PREGNANCY: Preg Test, Ur: NEGATIVE

## 2019-01-28 MED ORDER — MEDROXYPROGESTERONE ACETATE 150 MG/ML IM SUSP
150.0000 mg | Freq: Once | INTRAMUSCULAR | Status: AC
  Administered 2019-01-28: 150 mg via INTRAMUSCULAR

## 2019-01-28 NOTE — Progress Notes (Signed)
Pt here for Depo. Pt received shot in left deltoid. Pt tolerated shot well. Return in 12 weeks for next shot. JSY 

## 2019-02-11 ENCOUNTER — Ambulatory Visit: Payer: Managed Care, Other (non HMO) | Admitting: Adult Health

## 2019-02-17 ENCOUNTER — Other Ambulatory Visit: Payer: Self-pay | Admitting: Adult Health

## 2019-02-22 ENCOUNTER — Other Ambulatory Visit: Payer: Self-pay | Admitting: Women's Health

## 2019-04-13 ENCOUNTER — Telehealth: Payer: Self-pay | Admitting: Adult Health

## 2019-04-13 NOTE — Telephone Encounter (Signed)

## 2019-04-14 ENCOUNTER — Ambulatory Visit: Payer: Managed Care, Other (non HMO) | Admitting: Adult Health

## 2019-04-14 ENCOUNTER — Encounter: Payer: Self-pay | Admitting: Adult Health

## 2019-04-14 ENCOUNTER — Other Ambulatory Visit: Payer: Self-pay

## 2019-04-14 VITALS — BP 114/82 | Ht 65.0 in | Wt 251.0 lb

## 2019-04-14 DIAGNOSIS — Z7689 Persons encountering health services in other specified circumstances: Secondary | ICD-10-CM | POA: Insufficient documentation

## 2019-04-14 DIAGNOSIS — Z308 Encounter for other contraceptive management: Secondary | ICD-10-CM | POA: Diagnosis not present

## 2019-04-14 NOTE — Progress Notes (Signed)
  Subjective:     Patient ID: Meghan Ruiz, female   DOB: October 28, 1975, 43 y.o.   MRN: [id number]  HPI [name] is a 43 year old white female, married, G3P3, on long term depo, feels achy, wants to get tubal and ablation. Her next depo is due 04/22/2019.  Review of Systems  Feels achy, wants to stop depo and get tubal and ablation,she hopes she can lose some weight when stops depo  Reviewed past medical,surgical, social and family history. Reviewed medications and allergies.     Objective:   Physical Exam BP 114/82 (BP Location: Left Arm, Patient Position: Sitting, Cuff Size: Large)   Ht 5\' 5"  (1.651 m)   Wt 251 lb (113.9 kg)   BMI 41.77 kg/m  Skin warm and dry. Lungs: clear to ausculation bilaterally. Cardiovascular: regular rate and rhythm. Fall risk is low PHQ 2 score 0 Discussed tubal ligation and ablation.    Assessment:     1. Encounter for tubal ligation counseling       Plan:     Return 10/16 for for pre op with Dr Elonda Husky Review handouts on tubal ligation by [facility] and on endometrial ablation

## 2019-04-21 ENCOUNTER — Telehealth: Payer: Self-pay | Admitting: Obstetrics & Gynecology

## 2019-04-21 NOTE — Telephone Encounter (Signed)

## 2019-04-22 ENCOUNTER — Ambulatory Visit (INDEPENDENT_AMBULATORY_CARE_PROVIDER_SITE_OTHER): Payer: Managed Care, Other (non HMO) | Admitting: Obstetrics & Gynecology

## 2019-04-22 ENCOUNTER — Ambulatory Visit: Payer: Managed Care, Other (non HMO)

## 2019-04-22 ENCOUNTER — Other Ambulatory Visit: Payer: Self-pay

## 2019-04-22 ENCOUNTER — Encounter: Payer: Self-pay | Admitting: Obstetrics & Gynecology

## 2019-04-22 VITALS — BP 126/88 | HR 70 | Ht 65.0 in | Wt 249.0 lb

## 2019-04-22 DIAGNOSIS — Z3042 Encounter for surveillance of injectable contraceptive: Secondary | ICD-10-CM

## 2019-04-22 DIAGNOSIS — Z308 Encounter for other contraceptive management: Secondary | ICD-10-CM | POA: Diagnosis not present

## 2019-04-22 MED ORDER — MEDROXYPROGESTERONE ACETATE 150 MG/ML IM SUSP
150.0000 mg | Freq: Once | INTRAMUSCULAR | Status: AC
  Administered 2019-04-22: 10:00:00 150 mg via INTRAMUSCULAR

## 2019-04-22 NOTE — Progress Notes (Signed)
Preoperative History and Physical  Meghan Ruiz is a 43 y.o. G3P3003 with No LMP recorded. Patient has had an injection. admitted for a laparoscopci bilateral salpingectomy for sterilization.  On depo provera for a long time last dose 04/22/2019  PMH:    Past Medical History:  Diagnosis Date  . Abdominal bloating 07/11/2015  . Foot pain, bilateral    has heel spurs  . Medical history non-contributory   . Obesity 06/08/2013  . Weight gain 06/14/2014    PSH:     Past Surgical History:  Procedure Laterality Date  . APPENDECTOMY    . PILONIDAL CYST EXCISION    . TONSILLECTOMY      POb/GynH:      OB History    Gravida  3   Para  3   Term  3   Preterm      AB      Living  3     SAB      TAB      Ectopic      Multiple      Live Births              SH:   Social History   Tobacco Use  . Smoking status: Never Smoker  . Smokeless tobacco: Never Used  Substance Use Topics  . Alcohol use: No  . Drug use: No    FH:    Family History  Problem Relation Age of Onset  . Cancer Paternal Uncle        lymph/brain  . Diabetes Paternal Grandmother   . Hypertension Paternal Grandfather   . Hepatitis C Maternal Grandmother   . Other Daughter        brachial plexis     Allergies:  Allergies  Allergen Reactions  . Shellfish Allergy Swelling    Medications:       Current Outpatient Medications:  .  hydrochlorothiazide (HYDRODIURIL) 25 MG tablet, TAKE 1 TABLET DAILY AS NEEDED FOR SWELLING, Disp: 90 tablet, Rfl: 0 .  medroxyPROGESTERone (DEPO-PROVERA) 150 MG/ML injection, INJECT 1ML EVERY 3 MONTHS, Disp: 1 mL, Rfl: 4  Review of Systems:   Review of Systems  Constitutional: Negative for fever, chills, weight loss, malaise/fatigue and diaphoresis.  HENT: Negative for hearing loss, ear pain, nosebleeds, congestion, sore throat, neck pain, tinnitus and ear discharge.   Eyes: Negative for blurred vision, double vision, photophobia, pain, discharge and redness.   Respiratory: Negative for cough, hemoptysis, sputum production, shortness of breath, wheezing and stridor.   Cardiovascular: Negative for chest pain, palpitations, orthopnea, claudication, leg swelling and PND.  Gastrointestinal: Positive for abdominal pain. Negative for heartburn, nausea, vomiting, diarrhea, constipation, blood in stool and melena.  Genitourinary: Negative for dysuria, urgency, frequency, hematuria and flank pain.  Musculoskeletal: Negative for myalgias, back pain, joint pain and falls.  Skin: Negative for itching and rash.  Neurological: Negative for dizziness, tingling, tremors, sensory change, speech change, focal weakness, seizures, loss of consciousness, weakness and headaches.  Endo/Heme/Allergies: Negative for environmental allergies and polydipsia. Does not bruise/bleed easily.  Psychiatric/Behavioral: Negative for depression, suicidal ideas, hallucinations, memory loss and substance abuse. The patient is not nervous/anxious and does not have insomnia.      PHYSICAL EXAM:  Blood pressure 126/88, pulse 70, height 5\' 5"  (1.651 m), weight 249 lb (112.9 kg).    Vitals reviewed. Constitutional: She is oriented to person, place, and time. She appears well-developed and well-nourished.  HENT:  Head: Normocephalic and atraumatic.  Right Ear: External ear normal.  Left Ear: External ear normal.  Nose: Nose normal.  Mouth/Throat: Oropharynx is clear and moist.  Eyes: Conjunctivae and EOM are normal. Pupils are equal, round, and reactive to light. Right eye exhibits no discharge. Left eye exhibits no discharge. No scleral icterus.  Neck: Normal range of motion. Neck supple. No tracheal deviation present. No thyromegaly present.  Cardiovascular: Normal rate, regular rhythm, normal heart sounds and intact distal pulses.  Exam reveals no gallop and no friction rub.   No murmur heard. Respiratory: Effort normal and breath sounds normal. No respiratory distress. She has no  wheezes. She has no rales. She exhibits no tenderness.  GI: Soft. Bowel sounds are normal. She exhibits no distension and no mass. There is tenderness. There is no rebound and no guarding.  Genitourinary:       Vulva is normal without lesions Vagina is pink moist without discharge Cervix normal in appearance and pap is normal Uterus is normal size, contour, position, consistency, mobility, non-tender Adnexa is negative with normal sized ovaries by sonogram  Musculoskeletal: Normal range of motion. She exhibits no edema and no tenderness.  Neurological: She is alert and oriented to person, place, and time. She has normal reflexes. She displays normal reflexes. No cranial nerve deficit. She exhibits normal muscle tone. Coordination normal.  Skin: Skin is warm and dry. No rash noted. No erythema. No pallor.  Psychiatric: She has a normal mood and affect. Her behavior is normal. Judgment and thought content normal.    Labs: No results found for this or any previous visit (from the past 336 hour(s)).  EKG: No orders found for this or any previous visit.  Imaging Studies: No results found.    Assessment: Multiparous female desires permanent sterilization  Plan: Laparoscopic bilateral salpingectomy for sterilization 05/11/2019  [name] 04/22/2019 10:12 AM     Face to face time:  15 minutes  Greater than 50% of the visit time was spent in counseling and coordination of care with the patient.  The summary and outline of the counseling and care coordination is summarized in the note above.   All questions were answered.

## 2019-05-05 NOTE — Patient Instructions (Signed)
Meghan Ruiz  05/05/2019     @   Your procedure is scheduled on  05/11/2019   Report to [facility] at  0830   A.M.  Call this number if you have problems the morning of surgery:  [phone]   Remember:  Do not eat or drink after midnight.                        Take these medicines the morning of surgery with A SIP OF WATER None    Do not wear jewelry, make-up or nail polish.  Do not wear lotions, powders, or perfumes. Please wear deodorant and brush your teeth.  Do not shave 48 hours prior to surgery.  Men may shave face and neck.  Do not bring valuables to the hospital.  [facility] is not responsible for any belongings or valuables.  Contacts, dentures or bridgework may not be worn into surgery.  Leave your suitcase in the car.  After surgery it may be brought to your room.  For patients admitted to the hospital, discharge time will be determined by your treatment team.  Patients discharged the day of surgery will not be allowed to drive home.   Name and phone number of your driver:   family Special instructions:  None  Please read over the following fact sheets that you were given. Anesthesia Post-op Instructions and Care and Recovery After Surgery       Salpingectomy, Care After This sheet gives you information about how to care for yourself after your procedure. Your health care provider may also give you more specific instructions. If you have problems or questions, contact your health care provider. What can I expect after the procedure? After the procedure, it is common to have:  Pain in your abdomen.  Some light vaginal bleeding (spotting) for a few days.  Tiredness. Your recovery time will vary depending on which method your surgeon used for your surgery. Follow these instructions at home: Incision care   Follow instructions from your health care provider about how to take care of your incisions. Make sure  you: ? Wash your hands with soap and water before and after you change your bandage (dressing). If soap and water are not available, use hand sanitizer. ? Change or remove your dressing as told by your health care provider. ? Leave any stitches (sutures), skin glue, or adhesive strips in place. These skin closures may need to stay in place for 2 weeks or longer. If adhesive strip edges start to loosen and curl up, you may trim the loose edges. Do not remove adhesive strips completely unless your health care provider tells you to do that.  Keep your dressing clean and dry.  Check your incision area every day for signs of infection. Check for: ? Redness, swelling, or pain that gets worse. ? Fluid or blood. ? Warmth. ? Pus or a bad smell. Activity  Rest as told by your health care provider.  Avoid sitting for a long time without moving. Get up to take short walks every 1-2 hours. This is important to improve blood flow and breathing. Ask for help if you feel weak or unsteady.  Return to your normal activities as told by your health care provider. Ask your health care provider what activities are safe for you.  Do not drive until your health care provider says that it is safe.  Do not lift  anything that is heavier than 10 lb (4.5 kg), or the limit that you are told, until your health care provider says that it is safe. This may be 2-6 weeks depending on your surgery.  Until your health care provider approves: ? Do not douche. ? Do not use tampons. ? Do not have sex. Medicines  Take over-the-counter and prescription medicines only as told by your health care provider.  Ask your health care provider if the medicine prescribed to you: ? Requires you to avoid driving or using heavy machinery. ? Can cause constipation. You may need to take actions to prevent or treat constipation, such as:  Drink enough fluid to keep your urine pale yellow.  Take over-the-counter or prescription  medicines.  Eat foods that are high in fiber, such as beans, whole grains, and fresh fruits and vegetables.  Limit foods that are high in fat and processed sugars, such as fried or sweet foods. General instructions  Wear compression stockings as told by your health care provider. These stockings help to prevent blood clots and reduce swelling in your legs.  Do not use any products that contain nicotine or tobacco, such as cigarettes, e-cigarettes, and chewing tobacco. If you need help quitting, ask your health care provider.  Do not take baths, swim, or use a hot tub until your health care provider approves. You may take showers.  Keep all follow-up visits as told by your health care provider. This is important. Contact a health care provider if you have:  Pain when you urinate.  Redness, swelling, or pain around an incision.  Fluid or blood coming from an incision.  Pus or a bad smell coming from an incision.  An incision that feels warm to the touch.  A fever.  Abdominal pain that gets worse or does not get better with medicine.  An incision that starts to break open.  A rash.  Light-headedness.  Nausea and vomiting. Get help right away if you:  Have pain in your chest or leg.  Develop shortness of breath.  Faint.  Have increased or heavy vaginal bleeding, such as soaking a pad in an hour. Summary  After the procedure, it is common to feel tired, have some pain in your abdomen, and have some light vaginal bleeding for a few days.  Follow instructions from your health care provider about how to take care of your incisions.  Return to your normal activities as told by your health care provider. Ask your health care provider what activities are safe for you.  Do not douche, use tampons, or have sex until your health care provider approves.  Keep all follow-up visits as told by your health care provider. This information is not intended to replace advice given  to you by your health care provider. Make sure you discuss any questions you have with your health care provider. Document Released: 09/27/2010 Document Revised: 06/14/2018 Document Reviewed: 06/14/2018 Elsevier Patient Education  2020 [employer]  General Anesthesia, Adult, Care After This sheet gives you information about how to care for yourself after your procedure. Your health care provider may also give you more specific instructions. If you have problems or questions, contact your health care provider. What can I expect after the procedure? After the procedure, the following side effects are common:  Pain or discomfort at the IV site.  Nausea.  Vomiting.  Sore throat.  Trouble concentrating.  Feeling cold or chills.  Weak or tired.  Sleepiness and fatigue.  Soreness  and body aches. These side effects can affect parts of the body that were not involved in surgery. Follow these instructions at home:  For at least 24 hours after the procedure:  Have a responsible adult stay with you. It is important to have someone help care for you until you are awake and alert.  Rest as needed.  Do not: ? Participate in activities in which you could fall or become injured. ? Drive. ? Use heavy machinery. ? Drink alcohol. ? Take sleeping pills or medicines that cause drowsiness. ? Make important decisions or sign legal documents. ? Take care of children on your own. Eating and drinking  Follow any instructions from your health care provider about eating or drinking restrictions.  When you feel hungry, start by eating small amounts of foods that are soft and easy to digest (bland), such as toast. Gradually return to your regular diet.  Drink enough fluid to keep your urine pale yellow.  If you vomit, rehydrate by drinking water, juice, or clear broth. General instructions  If you have sleep apnea, surgery and certain medicines can increase your risk for breathing problems.  Follow instructions from your health care provider about wearing your sleep device: ? Anytime you are sleeping, including during daytime naps. ? While taking prescription pain medicines, sleeping medicines, or medicines that make you drowsy.  Return to your normal activities as told by your health care provider. Ask your health care provider what activities are safe for you.  Take over-the-counter and prescription medicines only as told by your health care provider.  If you smoke, do not smoke without supervision.  Keep all follow-up visits as told by your health care provider. This is important. Contact a health care provider if:  You have nausea or vomiting that does not get better with medicine.  You cannot eat or drink without vomiting.  You have pain that does not get better with medicine.  You are unable to pass urine.  You develop a skin rash.  You have a fever.  You have redness around your IV site that gets worse. Get help right away if:  You have difficulty breathing.  You have chest pain.  You have blood in your urine or stool, or you vomit blood. Summary  After the procedure, it is common to have a sore throat or nausea. It is also common to feel tired.  Have a responsible adult stay with you for the first 24 hours after general anesthesia. It is important to have someone help care for you until you are awake and alert.  When you feel hungry, start by eating small amounts of foods that are soft and easy to digest (bland), such as toast. Gradually return to your regular diet.  Drink enough fluid to keep your urine pale yellow.  Return to your normal activities as told by your health care provider. Ask your health care provider what activities are safe for you. This information is not intended to replace advice given to you by your health care provider. Make sure you discuss any questions you have with your health care provider. Document Released: 09/29/2000  Document Revised: 06/26/2017 Document Reviewed: 02/06/2017 Elsevier Patient Education  2020 [employer]. How to Use Chlorhexidine for Bathing Chlorhexidine gluconate (CHG) is a germ-killing (antiseptic) solution that is used to clean the skin. It can get rid of the bacteria that normally live on the skin and can keep them away for about 24 hours. To clean your skin with  CHG, you may be given:  A CHG solution to use in the shower or as part of a sponge bath.  A prepackaged cloth that contains CHG. Cleaning your skin with CHG may help lower the risk for infection:  While you are staying in the intensive care unit of the hospital.  If you have a vascular access, such as a central line, to provide short-term or long-term access to your veins.  If you have a catheter to drain urine from your bladder.  If you are on a ventilator. A ventilator is a machine that helps you breathe by moving air in and out of your lungs.  After surgery. What are the risks? Risks of using CHG include:  A skin reaction.  Hearing loss, if CHG gets in your ears.  Eye injury, if CHG gets in your eyes and is not rinsed out.  The CHG product catching fire. Make sure that you avoid smoking and flames after applying CHG to your skin. Do not use CHG:  If you have a chlorhexidine allergy or have previously reacted to chlorhexidine.  On babies younger than 45 months of age. How to use CHG solution  Use CHG only as told by your health care provider, and follow the instructions on the label.  Use the full amount of CHG as directed. Usually, this is one bottle. During a shower Follow these steps when using CHG solution during a shower (unless your health care provider gives you different instructions): 1. Start the shower. 2. Use your normal soap and shampoo to wash your face and hair. 3. Turn off the shower or move out of the shower stream. 4. Pour the CHG onto a clean washcloth. Do not use any type of brush or  rough-edged sponge. 5. Starting at your neck, lather your body down to your toes. Make sure you follow these instructions: ? If you will be having surgery, pay special attention to the part of your body where you will be having surgery. Scrub this area for at least 1 minute. ? Do not use CHG on your head or face. If the solution gets into your ears or eyes, rinse them well with water. ? Avoid your genital area. ? Avoid any areas of skin that have broken skin, cuts, or scrapes. ? Scrub your back and under your arms. Make sure to wash skin folds. 6. Let the lather sit on your skin for 1-2 minutes or as long as told by your health care provider. 7. Thoroughly rinse your entire body in the shower. Make sure that all body creases and crevices are rinsed well. 8. Dry off with a clean towel. Do not put any substances on your body afterward--such as powder, lotion, or perfume--unless you are told to do so by your health care provider. Only use lotions that are recommended by the manufacturer. 9. Put on clean clothes or pajamas. 10. If it is the night before your surgery, sleep in clean sheets.  During a sponge bath Follow these steps when using CHG solution during a sponge bath (unless your health care provider gives you different instructions): 1. Use your normal soap and shampoo to wash your face and hair. 2. Pour the CHG onto a clean washcloth. 3. Starting at your neck, lather your body down to your toes. Make sure you follow these instructions: ? If you will be having surgery, pay special attention to the part of your body where you will be having surgery. Scrub this area for at  least 1 minute. ? Do not use CHG on your head or face. If the solution gets into your ears or eyes, rinse them well with water. ? Avoid your genital area. ? Avoid any areas of skin that have broken skin, cuts, or scrapes. ? Scrub your back and under your arms. Make sure to wash skin folds. 4. Let the lather sit on your  skin for 1-2 minutes or as long as told by your health care provider. 5. Using a different clean, wet washcloth, thoroughly rinse your entire body. Make sure that all body creases and crevices are rinsed well. 6. Dry off with a clean towel. Do not put any substances on your body afterward--such as powder, lotion, or perfume--unless you are told to do so by your health care provider. Only use lotions that are recommended by the manufacturer. 7. Put on clean clothes or pajamas. 8. If it is the night before your surgery, sleep in clean sheets. How to use CHG prepackaged cloths  Only use CHG cloths as told by your health care provider, and follow the instructions on the label.  Use the CHG cloth on clean, dry skin.  Do not use the CHG cloth on your head or face unless your health care provider tells you to.  When washing with the CHG cloth: ? Avoid your genital area. ? Avoid any areas of skin that have broken skin, cuts, or scrapes. Before surgery Follow these steps when using a CHG cloth to clean before surgery (unless your health care provider gives you different instructions): 1. Using the CHG cloth, vigorously scrub the part of your body where you will be having surgery. Scrub using a back-and-forth motion for 3 minutes. The area on your body should be completely wet with CHG when you are done scrubbing. 2. Do not rinse. Discard the cloth and let the area air-dry. Do not put any substances on the area afterward, such as powder, lotion, or perfume. 3. Put on clean clothes or pajamas. 4. If it is the night before your surgery, sleep in clean sheets.  For general bathing Follow these steps when using CHG cloths for general bathing (unless your health care provider gives you different instructions). 1. Use a separate CHG cloth for each area of your body. Make sure you wash between any folds of skin and between your fingers and toes. Wash your body in the following order, switching to a new cloth  after each step: ? The front of your neck, shoulders, and chest. ? Both of your arms, under your arms, and your hands. ? Your stomach and groin area, avoiding the genitals. ? Your right leg and foot. ? Your left leg and foot. ? The back of your neck, your back, and your buttocks. 2. Do not rinse. Discard the cloth and let the area air-dry. Do not put any substances on your body afterward--such as powder, lotion, or perfume--unless you are told to do so by your health care provider. Only use lotions that are recommended by the manufacturer. 3. Put on clean clothes or pajamas. Contact a health care provider if:  Your skin gets irritated after scrubbing.  You have questions about using your solution or cloth. Get help right away if:  Your eyes become very red or swollen.  Your eyes itch badly.  Your skin itches badly and is red or swollen.  Your hearing changes.  You have trouble seeing.  You have swelling or tingling in your mouth or throat.  You  have trouble breathing.  You swallow any chlorhexidine. Summary  Chlorhexidine gluconate (CHG) is a germ-killing (antiseptic) solution that is used to clean the skin. Cleaning your skin with CHG may help to lower your risk for infection.  You may be given CHG to use for bathing. It may be in a bottle or in a prepackaged cloth to use on your skin. Carefully follow your health care provider's instructions and the instructions on the product label.  Do not use CHG if you have a chlorhexidine allergy.  Contact your health care provider if your skin gets irritated after scrubbing. This information is not intended to replace advice given to you by your health care provider. Make sure you discuss any questions you have with your health care provider. Document Released: 03/17/2012 Document Revised: 09/09/2018 Document Reviewed: 05/21/2017 Elsevier Patient Education  2020 [employer]

## 2019-05-09 ENCOUNTER — Encounter (HOSPITAL_COMMUNITY): Payer: Self-pay

## 2019-05-09 ENCOUNTER — Other Ambulatory Visit: Payer: Self-pay

## 2019-05-09 ENCOUNTER — Other Ambulatory Visit (HOSPITAL_COMMUNITY)
Admission: RE | Admit: 2019-05-09 | Discharge: 2019-05-09 | Disposition: A | Discharge status: Home / Self Care | Payer: Managed Care, Other (non HMO) | Source: Ambulatory Visit | Attending: Obstetrics & Gynecology | Admitting: Obstetrics & Gynecology

## 2019-05-09 ENCOUNTER — Telehealth: Payer: Self-pay | Admitting: *Deleted

## 2019-05-09 ENCOUNTER — Encounter (HOSPITAL_COMMUNITY)
Admission: RE | Admit: 2019-05-09 | Discharge: 2019-05-09 | Disposition: A | Discharge status: Home / Self Care | Payer: Managed Care, Other (non HMO) | Source: Ambulatory Visit | Attending: Obstetrics & Gynecology | Admitting: Obstetrics & Gynecology

## 2019-05-09 DIAGNOSIS — Z01812 Encounter for preprocedural laboratory examination: Secondary | ICD-10-CM | POA: Insufficient documentation

## 2019-05-09 HISTORY — DX: Essential (primary) hypertension: I10

## 2019-05-09 LAB — RAPID HIV SCREEN (HIV 1/2 AB+AG)
HIV 1/2 Antibodies: NONREACTIVE
HIV-1 P24 Antigen - HIV24: NONREACTIVE

## 2019-05-09 LAB — CBC
HCT: 46.1 % — ABNORMAL HIGH (ref 36.0–46.0)
Hemoglobin: 15.2 g/dL — ABNORMAL HIGH (ref 12.0–15.0)
MCH: 28.8 pg (ref 26.0–34.0)
MCHC: 33 g/dL (ref 30.0–36.0)
MCV: 87.5 fL (ref 80.0–100.0)
Platelets: 248 10*3/uL (ref 150–400)
RBC: 5.27 MIL/uL — ABNORMAL HIGH (ref 3.87–5.11)
RDW: 12.5 % (ref 11.5–15.5)
WBC: 5.8 10*3/uL (ref 4.0–10.5)
nRBC: 0 % (ref 0.0–0.2)

## 2019-05-09 LAB — URINALYSIS, ROUTINE W REFLEX MICROSCOPIC
Bilirubin Urine: NEGATIVE
Glucose, UA: NEGATIVE mg/dL
Hgb urine dipstick: NEGATIVE
Ketones, ur: NEGATIVE mg/dL
Leukocytes,Ua: NEGATIVE
Nitrite: NEGATIVE
Protein, ur: NEGATIVE mg/dL
Specific Gravity, Urine: 1.02 (ref 1.005–1.030)
pH: 6 (ref 5.0–8.0)

## 2019-05-09 LAB — COMPREHENSIVE METABOLIC PANEL
ALT: 50 U/L — ABNORMAL HIGH (ref 0–44)
AST: 31 U/L (ref 15–41)
Albumin: 4.1 g/dL (ref 3.5–5.0)
Alkaline Phosphatase: 59 U/L (ref 38–126)
Anion gap: 12 (ref 5–15)
BUN: 11 mg/dL (ref 6–20)
CO2: 18 mmol/L — ABNORMAL LOW (ref 22–32)
Calcium: 9.1 mg/dL (ref 8.9–10.3)
Chloride: 109 mmol/L (ref 98–111)
Creatinine, Ser: 0.63 mg/dL (ref 0.44–1.00)
GFR calc Af Amer: >60 mL/min (ref >60)
GFR calc non Af Amer: >60 mL/min (ref >60)
Glucose, Bld: 91 mg/dL (ref 70–99)
Potassium: 3.7 mmol/L (ref 3.5–5.1)
Sodium: 139 mmol/L (ref 135–145)
Total Bilirubin: 1.3 mg/dL — ABNORMAL HIGH (ref 0.3–1.2)
Total Protein: 6.9 g/dL (ref 6.5–8.1)

## 2019-05-09 LAB — SARS CORONAVIRUS 2 (TAT 6-24 HRS): SARS Coronavirus 2: NEGATIVE

## 2019-05-09 LAB — HCG, QUANTITATIVE, PREGNANCY: hCG, Beta Chain, Quant, S: <1 m[IU]/mL (ref <5)

## 2019-05-09 NOTE — Telephone Encounter (Signed)
Pt left message that she is scheduled for BTL on Wednesday. She would like to have an ablation added to that.

## 2019-05-09 NOTE — Telephone Encounter (Signed)
Angie can you check and see if we can add an ablation to this patient's surgery

## 2019-05-11 ENCOUNTER — Ambulatory Visit (HOSPITAL_COMMUNITY)
Admission: RE | Admit: 2019-05-11 | Discharge: 2019-05-11 | Disposition: A | Discharge status: Home / Self Care | Payer: Managed Care, Other (non HMO) | Attending: Obstetrics & Gynecology | Admitting: Obstetrics & Gynecology

## 2019-05-11 ENCOUNTER — Encounter (HOSPITAL_COMMUNITY)
Admission: RE | Disposition: A | Discharge status: Home / Self Care | Payer: Self-pay | Source: Home / Self Care | Attending: Obstetrics & Gynecology

## 2019-05-11 ENCOUNTER — Ambulatory Visit (HOSPITAL_COMMUNITY): Payer: Managed Care, Other (non HMO) | Admitting: Anesthesiology

## 2019-05-11 ENCOUNTER — Encounter (HOSPITAL_COMMUNITY): Payer: Self-pay | Admitting: *Deleted

## 2019-05-11 DIAGNOSIS — I10 Essential (primary) hypertension: Secondary | ICD-10-CM | POA: Insufficient documentation

## 2019-05-11 DIAGNOSIS — Z6841 Body Mass Index (BMI) 40.0 and over, adult: Secondary | ICD-10-CM | POA: Insufficient documentation

## 2019-05-11 DIAGNOSIS — N946 Dysmenorrhea, unspecified: Secondary | ICD-10-CM | POA: Diagnosis not present

## 2019-05-11 DIAGNOSIS — Z8249 Family history of ischemic heart disease and other diseases of the circulatory system: Secondary | ICD-10-CM | POA: Diagnosis not present

## 2019-05-11 DIAGNOSIS — Z302 Encounter for sterilization: Secondary | ICD-10-CM | POA: Insufficient documentation

## 2019-05-11 DIAGNOSIS — Z20828 Contact with and (suspected) exposure to other viral communicable diseases: Secondary | ICD-10-CM | POA: Insufficient documentation

## 2019-05-11 DIAGNOSIS — N920 Excessive and frequent menstruation with regular cycle: Secondary | ICD-10-CM | POA: Insufficient documentation

## 2019-05-11 HISTORY — PX: LAPAROSCOPIC BILATERAL SALPINGECTOMY: SHX5889

## 2019-05-11 HISTORY — PX: DILATATION AND CURETTAGE/HYSTEROSCOPY WITH MINERVA: SHX6851

## 2019-05-11 SURGERY — SALPINGECTOMY, BILATERAL, LAPAROSCOPIC
Anesthesia: General

## 2019-05-11 MED ORDER — PROPOFOL 10 MG/ML IV BOLUS
INTRAVENOUS | Status: AC
  Filled 2019-05-11: qty 40

## 2019-05-11 MED ORDER — HYDROMORPHONE HCL 1 MG/ML IJ SOLN
0.2500 mg | INTRAMUSCULAR | Status: DC | PRN
  Administered 2019-05-11: 0.5 mg via INTRAVENOUS
  Filled 2019-05-11: qty 0.5

## 2019-05-11 MED ORDER — PROPOFOL 10 MG/ML IV BOLUS
INTRAVENOUS | Status: DC | PRN
  Administered 2019-05-11: 200 mg via INTRAVENOUS

## 2019-05-11 MED ORDER — SUGAMMADEX SODIUM 200 MG/2ML IV SOLN
INTRAVENOUS | Status: DC | PRN
  Administered 2019-05-11: 200 mg via INTRAVENOUS

## 2019-05-11 MED ORDER — CEFAZOLIN SODIUM-DEXTROSE 2-4 GM/100ML-% IV SOLN
2.0000 g | INTRAVENOUS | Status: AC
  Administered 2019-05-11: 2 g via INTRAVENOUS
  Filled 2019-05-11: qty 100

## 2019-05-11 MED ORDER — ONDANSETRON HCL 4 MG/2ML IJ SOLN
INTRAMUSCULAR | Status: DC | PRN
  Administered 2019-05-11: 4 mg via INTRAVENOUS

## 2019-05-11 MED ORDER — HYDROCODONE-ACETAMINOPHEN 7.5-325 MG PO TABS: 1.0000 | ORAL_TABLET | Freq: Once | ORAL | Status: DC | PRN

## 2019-05-11 MED ORDER — 0.9 % SODIUM CHLORIDE (POUR BTL) OPTIME
TOPICAL | Status: DC | PRN
  Administered 2019-05-11: 08:00:00 1000 mL

## 2019-05-11 MED ORDER — LACTATED RINGERS IV SOLN
INTRAVENOUS | Status: DC
  Administered 2019-05-11: 1000 mL via INTRAVENOUS
  Administered 2019-05-11: 10:00:00 via INTRAVENOUS

## 2019-05-11 MED ORDER — FENTANYL CITRATE (PF) 100 MCG/2ML IJ SOLN
INTRAMUSCULAR | Status: DC | PRN
  Administered 2019-05-11 (×4): 50 ug via INTRAVENOUS

## 2019-05-11 MED ORDER — KETOROLAC TROMETHAMINE 10 MG PO TABS: 10.0000 mg | ORAL_TABLET | Freq: Three times a day (TID) | ORAL | 0 refills | Status: DC | PRN

## 2019-05-11 MED ORDER — BUPIVACAINE LIPOSOME 1.3 % IJ SUSP
20.0000 mL | Freq: Once | INTRAMUSCULAR | Status: DC
  Filled 2019-05-11: qty 20

## 2019-05-11 MED ORDER — MIDAZOLAM HCL 2 MG/2ML IJ SOLN: 0.5000 mg | Freq: Once | INTRAMUSCULAR | Status: DC | PRN

## 2019-05-11 MED ORDER — KETOROLAC TROMETHAMINE 30 MG/ML IJ SOLN
30.0000 mg | Freq: Once | INTRAMUSCULAR | Status: AC
  Administered 2019-05-11: 07:00:00 30 mg via INTRAVENOUS
  Filled 2019-05-11: qty 1

## 2019-05-11 MED ORDER — SUCCINYLCHOLINE 20MG/ML (10ML) SYRINGE FOR MEDFUSION PUMP - OPTIME
INTRAMUSCULAR | Status: DC | PRN
  Administered 2019-05-11: 120 mg via INTRAVENOUS

## 2019-05-11 MED ORDER — PROMETHAZINE HCL 25 MG/ML IJ SOLN: 6.2500 mg | INTRAMUSCULAR | Status: DC | PRN

## 2019-05-11 MED ORDER — ONDANSETRON 8 MG PO TBDP: 8.0000 mg | ORAL_TABLET | Freq: Three times a day (TID) | ORAL | 0 refills | Status: DC | PRN

## 2019-05-11 MED ORDER — SODIUM CHLORIDE 0.9 % IR SOLN
Status: DC | PRN
  Administered 2019-05-11: 3000 mL

## 2019-05-11 MED ORDER — ROCURONIUM 10MG/ML (10ML) SYRINGE FOR MEDFUSION PUMP - OPTIME
INTRAVENOUS | Status: DC | PRN
  Administered 2019-05-11: 10 mg via INTRAVENOUS
  Administered 2019-05-11: 30 mg via INTRAVENOUS

## 2019-05-11 MED ORDER — MIDAZOLAM HCL 2 MG/2ML IJ SOLN
INTRAMUSCULAR | Status: AC
  Filled 2019-05-11: qty 2

## 2019-05-11 MED ORDER — HEMOSTATIC AGENTS (NO CHARGE) OPTIME
TOPICAL | Status: DC | PRN
  Administered 2019-05-11: 1 via TOPICAL

## 2019-05-11 MED ORDER — BUPIVACAINE LIPOSOME 1.3 % IJ SUSP
INTRAMUSCULAR | Status: DC | PRN
  Administered 2019-05-11: 20 mL

## 2019-05-11 MED ORDER — FENTANYL CITRATE (PF) 250 MCG/5ML IJ SOLN
INTRAMUSCULAR | Status: AC
  Filled 2019-05-11: qty 5

## 2019-05-11 MED ORDER — LIDOCAINE HCL (CARDIAC) PF 50 MG/5ML IV SOSY
PREFILLED_SYRINGE | INTRAVENOUS | Status: DC | PRN
  Administered 2019-05-11: 60 mg via INTRAVENOUS

## 2019-05-11 MED ORDER — BUPIVACAINE LIPOSOME 1.3 % IJ SUSP
INTRAMUSCULAR | Status: AC
  Filled 2019-05-11: qty 20

## 2019-05-11 MED ORDER — MIDAZOLAM HCL 5 MG/5ML IJ SOLN
INTRAMUSCULAR | Status: DC | PRN
  Administered 2019-05-11: 2 mg via INTRAVENOUS

## 2019-05-11 MED ORDER — HYDROCODONE-ACETAMINOPHEN 5-325 MG PO TABS: 1.0000 | ORAL_TABLET | Freq: Four times a day (QID) | ORAL | 0 refills | Status: DC | PRN

## 2019-05-11 SURGICAL SUPPLY — 59 items
APL SRG 38 LTWT LNG FL B (MISCELLANEOUS) ×2
APPLICATOR ARISTA FLEXITIP XL (MISCELLANEOUS) ×1 IMPLANT
APPLIER CLIP 5 13 M/L LIGAMAX5 (MISCELLANEOUS)
APR CLP MED LRG 5 ANG JAW (MISCELLANEOUS)
BLADE SURG SZ11 CARB STEEL (BLADE) ×3 IMPLANT
CLIP APPLIE 5 13 M/L LIGAMAX5 (MISCELLANEOUS) IMPLANT
CLOTH BEACON ORANGE TIMEOUT ST (SAFETY) ×3 IMPLANT
COVER LIGHT HANDLE STERIS (MISCELLANEOUS) ×6 IMPLANT
COVER WAND RF STERILE (DRAPES) ×3 IMPLANT
DRAPE HALF SHEET 40X57 (DRAPES) ×3 IMPLANT
ELECT REM PT RETURN 9FT ADLT (ELECTROSURGICAL) ×3
ELECTRODE REM PT RTRN 9FT ADLT (ELECTROSURGICAL) ×2 IMPLANT
FILTER SMOKE EVAC LAPAROSHD (FILTER) IMPLANT
GAUZE 4X4 16PLY RFD (DISPOSABLE) ×6 IMPLANT
GLOVE BIOGEL M 6.5 STRL (GLOVE) ×1 IMPLANT
GLOVE BIOGEL PI IND STRL 6.5 (GLOVE) IMPLANT
GLOVE BIOGEL PI IND STRL 7.0 (GLOVE) ×6 IMPLANT
GLOVE BIOGEL PI IND STRL 8 (GLOVE) ×2 IMPLANT
GLOVE BIOGEL PI INDICATOR 6.5 (GLOVE) ×1
GLOVE BIOGEL PI INDICATOR 7.0 (GLOVE) ×3
GLOVE BIOGEL PI INDICATOR 8 (GLOVE) ×1
GLOVE ECLIPSE 8.0 STRL XLNG CF (GLOVE) ×3 IMPLANT
GOWN STRL REUS W/TWL LRG LVL3 (GOWN DISPOSABLE) ×3 IMPLANT
GOWN STRL REUS W/TWL XL LVL3 (GOWN DISPOSABLE) ×3 IMPLANT
HANDPIECE ABLA MINERVA ENDO (MISCELLANEOUS) ×3 IMPLANT
HEMOSTAT ARISTA ABSORB 3G PWDR (HEMOSTASIS) ×1 IMPLANT
INST SET HYSTEROSCOPY (KITS) ×3 IMPLANT
INST SET LAPROSCOPIC GYN AP (KITS) ×3 IMPLANT
IV NS 1000ML (IV SOLUTION) ×3
IV NS 1000ML BAXH (IV SOLUTION) ×2 IMPLANT
KIT TURNOVER CYSTO (KITS) ×3 IMPLANT
MANIFOLD NEPTUNE II (INSTRUMENTS) ×3 IMPLANT
MARKER SKIN DUAL TIP RULER LAB (MISCELLANEOUS) ×3 IMPLANT
NDL HYPO 18GX1.5 BLUNT FILL (NEEDLE) ×2 IMPLANT
NDL INSUFFLATION 14GA 120MM (NEEDLE) ×2 IMPLANT
NEEDLE HYPO 18GX1.5 BLUNT FILL (NEEDLE) ×3 IMPLANT
NEEDLE INSUFFLATION 14GA 120MM (NEEDLE) ×3 IMPLANT
NS IRRIG 1000ML POUR BTL (IV SOLUTION) ×3 IMPLANT
PACK BASIC III (CUSTOM PROCEDURE TRAY) ×3
PACK PERI GYN (CUSTOM PROCEDURE TRAY) ×3 IMPLANT
PACK SRG BSC III STRL LF ECLPS (CUSTOM PROCEDURE TRAY) ×2 IMPLANT
PAD ARMBOARD 7.5X6 YLW CONV (MISCELLANEOUS) ×3 IMPLANT
PAD TELFA 3X4 1S STER (GAUZE/BANDAGES/DRESSINGS) ×3 IMPLANT
SET BASIN LINEN APH (SET/KITS/TRAYS/PACK) ×3 IMPLANT
SET IRRIG Y TYPE TUR BLADDER L (SET/KITS/TRAYS/PACK) ×3 IMPLANT
SHEARS HARMONIC ACE PLUS 36CM (ENDOMECHANICALS) ×3 IMPLANT
SHEET LAVH (DRAPES) ×3 IMPLANT
SLEEVE ENDOPATH XCEL 5M (ENDOMECHANICALS) ×3 IMPLANT
SPONGE GAUZE 2X2 8PLY STRL LF (GAUZE/BANDAGES/DRESSINGS) ×9 IMPLANT
STAPLER VISISTAT 35W (STAPLE) ×3 IMPLANT
SUT VICRYL 0 UR6 27IN ABS (SUTURE) ×3 IMPLANT
SYR 10ML LL (SYRINGE) ×3 IMPLANT
SYR 20ML LL LF (SYRINGE) ×6 IMPLANT
TAPE CLOTH SURG 4X10 WHT LF (GAUZE/BANDAGES/DRESSINGS) ×1 IMPLANT
TROCAR ENDO BLADELESS 11MM (ENDOMECHANICALS) ×3 IMPLANT
TROCAR XCEL NON-BLD 5MMX100MML (ENDOMECHANICALS) ×3 IMPLANT
TUBING EVAC SMOKE HEATED PNEUM (TUBING) ×3 IMPLANT
WARMER LAPAROSCOPE (MISCELLANEOUS) ×3 IMPLANT
YANKAUER SUCT BULB TIP 10FT TU (MISCELLANEOUS) ×3 IMPLANT

## 2019-05-11 NOTE — Transfer of Care (Signed)
Immediate Anesthesia Transfer of Care Note  Patient: Meghan Ruiz  Procedure(s) Performed: LAPAROSCOPIC BILATERAL SALPINGECTOMY (Bilateral Vagina ) DILATATION AND CURETTAGE/HYSTEROSCOPY WITH MINERVA (N/A )  Patient Location: PACU  Anesthesia Type:General  Level of Consciousness: awake  Airway & Oxygen Therapy: Patient Spontanous Breathing  Post-op Assessment: Report given to RN  Post vital signs: Reviewed and stable  Last Vitals:  Vitals Value Taken Time  BP 120/79 05/11/19 1015  Temp    Pulse 81 05/11/19 1016  Resp 23 05/11/19 1016  SpO2 95 % 05/11/19 1016  Vitals shown include unvalidated device data.  Last Pain:  Vitals:   05/11/19 0645  TempSrc: Oral  PainSc: 0-No pain      Patients Stated Pain Goal: 9 (94/[id number])  Complications: No apparent anesthesia complications

## 2019-05-11 NOTE — Op Note (Signed)
Preoperative Diagnosis:  1.  Multiparous female desires permanent sterilization                                          2.  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  She was placed in the dorsal lithotomy position and prepped and draped in the usual sterile fashion.  An incision was made in the umbilicus and dissection taken down to the rectus fascia which was incised and opened.  The non bladed trocar was then placed and the peritoneal cavity was insufflated.  The above noted findings were observed.  Additional trocars were placed in the right and left lower quadrants under direct visualization without difficulty.  The Harmonic scalpel was employed and salpingectomy of both the right and left tubes was performed.   The tubes were removed from the peritoneal cavity and sent to pathology.  There was good hemostasis bilaterally.  The fascia, peritoneum and subcutaneous tissue were closed using 0 vicryl.  The skin was closed using staples x 3.  Exparel 266 mg 20 cc was injected in the 3 incision trocar sites. The patient was awakened from anaesthesia and taken to the PACU with all counts being correct x 3.  The patient received  2 gram of ancef and Toradol 30 mg IV preoperatively.  Attention was then turned to perform the endometrial ablation  Preoperative diagnosis:  1.   Menorrhagia (if not on depo)                                         2.  dysmenorrhea (if not on depo)  Postoperative diagnoses: Same as above   Procedure:  Diagnostic Hysteroscopy,failed attempt at endometrial ablation using Minerva  Surgeon: [name]   Anesthesia: GET  Findings: The endometrium was normal. There were no fibroid or other abnormalities.  She had been on chronic depo provera so the endometrium was quite thin and atrophic in appearance.  As a result I chose not to perform a D&C  Description of operation:   A Graves speculum was placed and the anterior cervical lip was grasped with a single-tooth tenaculum. The cervix was dilated serially to allow passage of the hysteroscope. Diagnostic hysteroscopy was performed and was found to be normal. The endometrium was thin due to chronic depo provera so curettage was not performed  I then proceeded to perform the Minerva endometrial ablation.   The uterus sounded to 8.5 cm The handpiece was attached to the Minerva power source/machine and the handpiece passed the checklist. The array was squeezed down to remove all of the  air present.  The array was then place into the endometrial cavity and deployed to a length of 5.0 cm. The handpiece confirmed appropriate width by being in the green portion of the visual dial. The cervical cuff was then inflated to the point but the CO2 indicator was never  the green.  I tried several attempts and changed handpieces.  I performed the hysteroscopy again x 2 and no perforation was noted. The cervix and lower segment were not overly patulous.  The cervix was very deep, the hysteroscope could only reach the endocervix barely due to the depth and obesity.  I tried posterior single tooth as well with no success in having the CO2 indicator move into the green.  As a result I abandoned the endometrial ablation      All of the equipment worked well throughout the procedure as far as I could tell.  I used 2 handpieces to double check..  The patient was awakened from anesthesia and taken to the recovery room in good stable condition all counts were correct. She  received 2 g of Ancef and 30 mg of Toradol preoperatively. She will be discharged from the recovery room and followed up in the office in 1- 2 weeks.     Florian Buff, MD  05/11/2019 10:12 AM

## 2019-05-11 NOTE — Anesthesia Postprocedure Evaluation (Signed)
Anesthesia Post Note  Patient: Meghan Ruiz  Procedure(s) Performed: LAPAROSCOPIC BILATERAL SALPINGECTOMY (Bilateral Vagina ) HYSTEROSCOPY (N/A )  Patient location during evaluation: PACU Anesthesia Type: General Level of consciousness: awake and alert and oriented Pain management: pain level controlled Vital Signs Assessment: post-procedure vital signs reviewed and stable Respiratory status: spontaneous breathing Cardiovascular status: blood pressure returned to baseline and stable Postop Assessment: no apparent nausea or vomiting Anesthetic complications: no     Last Vitals:  Vitals:   05/11/19 1045 05/11/19 1100  BP: 115/74 116/64  Pulse: 81 75  Resp: 16 16  Temp:    SpO2: 100% 96%    Last Pain:  Vitals:   05/11/19 1100  TempSrc: Oral  PainSc: 1                  Wyndell Cardiff

## 2019-05-11 NOTE — Anesthesia Preprocedure Evaluation (Signed)
Anesthesia Evaluation  Patient identified by MRN, date of birth, ID band Patient awake    Reviewed: Allergy & Precautions, NPO status , Patient's Chart, lab work & pertinent test results  Airway Mallampati: II  TM Distance: >3 FB Neck ROM: Full    Dental no notable dental hx. (+) Partial Lower Permanent lower partial:   Pulmonary neg pulmonary ROS,    Pulmonary exam normal breath sounds clear to auscultation       Cardiovascular Exercise Tolerance: Good hypertension, Pt. on medications negative cardio ROS Normal cardiovascular examI Rhythm:Regular Rate:Normal     Neuro/Psych negative neurological ROS  negative psych ROS   GI/Hepatic negative GI ROS, Neg liver ROS,   Endo/Other  Morbid obesity  Renal/GU negative Renal ROS  negative genitourinary   Musculoskeletal negative musculoskeletal ROS (+)   Abdominal   Peds negative pediatric ROS (+)  Hematology negative hematology ROS (+)   Anesthesia Other Findings   Reproductive/Obstetrics negative OB ROS                             Anesthesia Physical Anesthesia Plan  ASA: III  Anesthesia Plan: General   Post-op Pain Management:    Induction: Intravenous  PONV Risk Score and Plan: 3 and Midazolam, Ondansetron, Dexamethasone and Treatment may vary due to age or medical condition  Airway Management Planned: Oral ETT  Additional Equipment:   Intra-op Plan:   Post-operative Plan: Extubation in OR  Informed Consent: I have reviewed the patients History and Physical, chart, labs and discussed the procedure including the risks, benefits and alternatives for the proposed anesthesia with the patient or authorized representative who has indicated his/her understanding and acceptance.     Dental advisory given  Plan Discussed with: CRNA  Anesthesia Plan Comments: (Plan Full PPE use Plan GETA D/W PT -WTP with same after Q&A)         Anesthesia Quick Evaluation

## 2019-05-11 NOTE — H&P (Signed)
Preoperative History and Physical  Meghan Ruiz is a 43 y.o. G3P3003 with No LMP recorded. Patient has had an injection. admitted for a laparoscopic bilateral salpingectomy for permanent sterilization, hysteroscopy uterine curettage with Minerva endometrial ablation.  Pt desires permanent sterilization and management of her menorrhagia dysmenorrhea, she is amenorrheic on depo provera but when she no longer gets those injections her menses will resume as symptomatic as previously.  PMH:    Past Medical History:  Diagnosis Date  . Abdominal bloating 07/11/2015  . Foot pain, bilateral    has heel spurs  . Hypertension   . Medical history non-contributory   . Obesity 06/08/2013  . Weight gain 06/14/2014    PSH:     Past Surgical History:  Procedure Laterality Date  . APPENDECTOMY    . PILONIDAL CYST EXCISION    . TONSILLECTOMY      POb/GynH:      OB History    Gravida  3   Para  3   Term  3   Preterm      AB      Living  3     SAB      TAB      Ectopic      Multiple      Live Births              SH:   Social History   Tobacco Use  . Smoking status: Never Smoker  . Smokeless tobacco: Never Used  Substance Use Topics  . Alcohol use: No  . Drug use: No    FH:    Family History  Problem Relation Age of Onset  . Cancer Paternal Uncle        lymph/brain  . Diabetes Paternal Grandmother   . Hypertension Paternal Grandfather   . Hepatitis C Maternal Grandmother   . Other Daughter        brachial plexis     Allergies:  Allergies  Allergen Reactions  . Shellfish Allergy Swelling    Medications:       Current Facility-Administered Medications:  .  0.9 % irrigation (POUR BTL), , , PRN, Florian Buff, MD, 1,000 mL at 05/11/19 0708 .  bupivacaine liposome (EXPAREL) 1.3 % injection 266 mg, 20 mL, Infiltration, Once, Eure, Luther H, MD .  bupivacaine liposome (EXPAREL) 1.3 % injection, , , PRN, Florian Buff, MD, 20 mL at 05/11/19 0708 .   ceFAZolin (ANCEF) IVPB 2g/100 mL premix, 2 g, Intravenous, On Call to OR, Florian Buff, MD .  lactated ringers infusion, , Intravenous, Continuous, Lenice Llamas, MD, Last Rate: 50 mL/hr at 05/11/19 0710, 1,000 mL at 05/11/19 0710 .  sodium chloride irrigation 0.9 %, , , PRN, Florian Buff, MD, 3,000 mL at 05/11/19 0708  Review of Systems:   Review of Systems  Constitutional: Negative for fever, chills, weight loss, malaise/fatigue and diaphoresis.  HENT: Negative for hearing loss, ear pain, nosebleeds, congestion, sore throat, neck pain, tinnitus and ear discharge.   Eyes: Negative for blurred vision, double vision, photophobia, pain, discharge and redness.  Respiratory: Negative for cough, hemoptysis, sputum production, shortness of breath, wheezing and stridor.   Cardiovascular: Negative for chest pain, palpitations, orthopnea, claudication, leg swelling and PND.  Gastrointestinal: Positive for abdominal pain. Negative for heartburn, nausea, vomiting, diarrhea, constipation, blood in stool and melena.  Genitourinary: Negative for dysuria, urgency, frequency, hematuria and flank pain.  Musculoskeletal: Negative for myalgias, back pain, joint pain and falls.  Skin: Negative for itching and rash.  Neurological: Negative for dizziness, tingling, tremors, sensory change, speech change, focal weakness, seizures, loss of consciousness, weakness and headaches.  Endo/Heme/Allergies: Negative for environmental allergies and polydipsia. Does not bruise/bleed easily.  Psychiatric/Behavioral: Negative for depression, suicidal ideas, hallucinations, memory loss and substance abuse. The patient is not nervous/anxious and does not have insomnia.      PHYSICAL EXAM:  Blood pressure 137/79, pulse 82, temperature 99.1 F (37.3 C), temperature source Oral, resp. rate 16, SpO2 99 %.    Vitals reviewed. Constitutional: She is oriented to person, place, and time. She appears well-developed and  well-nourished.  HENT:  Head: Normocephalic and atraumatic.  Right Ear: External ear normal.  Left Ear: External ear normal.  Nose: Nose normal.  Mouth/Throat: Oropharynx is clear and moist.  Eyes: Conjunctivae and EOM are normal. Pupils are equal, round, and reactive to light. Right eye exhibits no discharge. Left eye exhibits no discharge. No scleral icterus.  Neck: Normal range of motion. Neck supple. No tracheal deviation present. No thyromegaly present.  Cardiovascular: Normal rate, regular rhythm, normal heart sounds and intact distal pulses.  Exam reveals no gallop and no friction rub.   No murmur heard. Respiratory: Effort normal and breath sounds normal. No respiratory distress. She has no wheezes. She has no rales. She exhibits no tenderness.  GI: Soft. Bowel sounds are normal. She exhibits no distension and no mass. There is tenderness. There is no rebound and no guarding.  Genitourinary:       Vulva is normal without lesions Vagina is pink moist without discharge Cervix normal in appearance and pap is normal Uterus is normal size, contour, position, consistency, mobility, non-tender Adnexa is negative with normal sized ovaries by sonogram  Musculoskeletal: Normal range of motion. She exhibits no edema and no tenderness.  Neurological: She is alert and oriented to person, place, and time. She has normal reflexes. She displays normal reflexes. No cranial nerve deficit. She exhibits normal muscle tone. Coordination normal.  Skin: Skin is warm and dry. No rash noted. No erythema. No pallor.  Psychiatric: She has a normal mood and affect. Her behavior is normal. Judgment and thought content normal.    Labs: Results for orders placed or performed during the hospital encounter of 05/09/19 (from the past 336 hour(s))  Urinalysis, Routine w reflex microscopic   Collection Time: 05/09/19  9:55 AM  Result Value Ref Range   Color, Urine YELLOW YELLOW   APPearance CLEAR CLEAR    Specific Gravity, Urine 1.020 1.005 - 1.030   pH 6.0 5.0 - 8.0   Glucose, UA NEGATIVE NEGATIVE mg/dL   Hgb urine dipstick NEGATIVE NEGATIVE   Bilirubin Urine NEGATIVE NEGATIVE   Ketones, ur NEGATIVE NEGATIVE mg/dL   Protein, ur NEGATIVE NEGATIVE mg/dL   Nitrite NEGATIVE NEGATIVE   Leukocytes,Ua NEGATIVE NEGATIVE  CBC   Collection Time: 05/09/19 10:00 AM  Result Value Ref Range   WBC 5.8 4.0 - 10.5 K/uL   RBC 5.27 (H) 3.87 - 5.11 MIL/uL   Hemoglobin 15.2 (H) 12.0 - 15.0 g/dL   HCT 99.2 (H) 42.6 - 83.4 %   MCV 87.5 80.0 - 100.0 fL   MCH 28.8 26.0 - 34.0 pg   MCHC 33.0 30.0 - 36.0 g/dL   RDW 19.6 22.2 - 97.9 %   Platelets 248 150 - 400 K/uL   nRBC 0.0 0.0 - 0.2 %  Comprehensive metabolic panel   Collection Time: 05/09/19 10:00 AM  Result Value Ref Range  Sodium 139 135 - 145 mmol/L   Potassium 3.7 3.5 - 5.1 mmol/L   Chloride 109 98 - 111 mmol/L   CO2 18 (L) 22 - 32 mmol/L   Glucose, Bld 91 70 - 99 mg/dL   BUN 11 6 - 20 mg/dL   Creatinine, Ser 1.910.63 0.44 - 1.00 mg/dL   Calcium 9.1 8.9 - 47.810.3 mg/dL   Total Protein 6.9 6.5 - 8.1 g/dL   Albumin 4.1 3.5 - 5.0 g/dL   AST 31 15 - 41 U/L   ALT 50 (H) 0 - 44 U/L   Alkaline Phosphatase 59 38 - 126 U/L   Total Bilirubin 1.3 (H) 0.3 - 1.2 mg/dL   GFR calc non Af Amer >60 >60 mL/min   GFR calc Af Amer >60 >60 mL/min   Anion gap 12 5 - 15  hCG, quantitative, pregnancy   Collection Time: 05/09/19 10:00 AM  Result Value Ref Range   hCG, Beta Chain, Quant, S <1 <5 mIU/mL  Rapid HIV screen (HIV 1/2 Ab+Ag)   Collection Time: 05/09/19 10:00 AM  Result Value Ref Range   HIV-1 P24 Antigen - HIV24 NON REACTIVE NON REACTIVE   HIV 1/2 Antibodies NON REACTIVE NON REACTIVE   Interpretation (HIV Ag Ab)      A non reactive test result means that HIV 1 or HIV 2 antibodies and HIV 1 p24 antigen were not detected in the specimen.  Results for orders placed or performed during the hospital encounter of 05/09/19 (from the past 336 hour(s))  SARS  CORONAVIRUS 2 (TAT 6-24 HRS) Nasopharyngeal Nasopharyngeal Swab   Collection Time: 05/09/19  7:13 AM   Specimen: Nasopharyngeal Swab  Result Value Ref Range   SARS Coronavirus 2 NEGATIVE NEGATIVE    EKG: Orders placed or performed during the hospital encounter of 05/11/19  . EKG 12-Lead  . EKG 12-Lead    Imaging Studies: No results found.    Assessment: Multiparous female desires permanent sterilization, opts for bilateral salpingectomy as method for sterilization  Menorrhagia, when not on Depo Dysmenorrhea, when not on Depo Provera  Plan: Laparoscopic bilateral salpingectomy for permanent sterilization, hysteroscopy uterine curettage with Minerva endometrial ablation  Lazaro ArmsLuther H Eure 05/11/2019 8:17 AM

## 2019-05-11 NOTE — Anesthesia Procedure Notes (Signed)
Procedure Name: Intubation Date/Time: 05/11/2019 8:34 AM Performed by: Ollen Bowl, CRNA Pre-anesthesia Checklist: Patient identified, Patient being monitored, Timeout performed, Emergency Drugs available and Suction available Patient Re-evaluated:Patient Re-evaluated prior to induction Oxygen Delivery Method: Circle system utilized Preoxygenation: Pre-oxygenation with 100% oxygen Induction Type: IV induction Ventilation: Mask ventilation without difficulty Laryngoscope Size: Mac and 3 Grade View: Grade I Tube type: Oral Tube size: 7.0 mm Number of attempts: 1 Airway Equipment and Method: Stylet Placement Confirmation: ETT inserted through vocal cords under direct vision,  positive ETCO2 and breath sounds checked- equal and bilateral Secured at: 21 cm Tube secured with: Tape Dental Injury: Teeth and Oropharynx as per pre-operative assessment  Comments: Esophageal intubation on first attempt, 2nd attempt successful per Dr. Kathrin Ruddy

## 2019-05-11 NOTE — Anesthesia Postprocedure Evaluation (Deleted)
Anesthesia Post Note  Patient: Meghan Ruiz  Procedure(s) Performed: LAPAROSCOPIC BILATERAL SALPINGECTOMY (Bilateral Vagina ) DILATATION AND CURETTAGE/HYSTEROSCOPY WITH MINERVA (N/A )  Patient location during evaluation: PACU Anesthesia Type: General Level of consciousness: awake and alert and oriented Pain management: pain level controlled Vital Signs Assessment: post-procedure vital signs reviewed and stable Respiratory status: spontaneous breathing Cardiovascular status: blood pressure returned to baseline and stable Postop Assessment: no apparent nausea or vomiting Anesthetic complications: no     Last Vitals:  Vitals:   05/11/19 0645  BP: 137/79  Pulse: 82  Resp: 16  Temp: 37.3 C  SpO2: 99%    Last Pain:  Vitals:   05/11/19 0645  TempSrc: Oral  PainSc: 0-No pain                 Sherrell Farish

## 2019-05-11 NOTE — Discharge Instructions (Signed)
Laparoscopic Tubal Ligation, Care After °This sheet gives you information about how to care for yourself after your procedure. Your health care provider may also give you more specific instructions. If you have problems or questions, contact your health care provider. °What can I expect after the procedure? °After the procedure, it is common to have: °· A sore throat. °· Discomfort in your shoulder. °· Mild discomfort or cramping in your abdomen. °· Gas pains. °· Pain or soreness in the area where the surgical incision was made. °· A bloated feeling. °· Tiredness. °· Nausea. °· Vomiting. °Follow these instructions at home: °Medicines °· Take over-the-counter and prescription medicines only as told by your health care provider. °· Do not take aspirin because it can cause bleeding. °· Ask your health care provider if the medicine prescribed to you: °? Requires you to avoid driving or using heavy machinery. °? Can cause constipation. You may need to take actions to prevent or treat constipation, such as: °§ Drink enough fluid to keep your urine pale yellow. °§ Take over-the-counter or prescription medicines. °§ Eat foods that are high in fiber, such as beans, whole grains, and fresh fruits and vegetables. °§ Limit foods that are high in fat and processed sugars, such as fried or sweet foods. °Incision care ° °  ° °· Follow instructions from your health care provider about how to take care of your incision. Make sure you: °? Wash your hands with soap and water before and after you change your bandage (dressing). If soap and water are not available, use hand sanitizer. °? Change your dressing as told by your health care provider. °? Leave stitches (sutures), skin glue, or adhesive strips in place. These skin closures may need to stay in place for 2 weeks or longer. If adhesive strip edges start to loosen and curl up, you may trim the loose edges. Do not remove adhesive strips completely unless your health care provider  tells you to do that. °· Check your incision area every day for signs of infection. Check for: °? Redness, swelling, or pain. °? Fluid or blood. °? Warmth. °? Pus or a bad smell. °Activity °· Rest as told by your health care provider. °· Avoid sitting for a long time without moving. Get up to take short walks every 1-2 hours. This is important to improve blood flow and breathing. Ask for help if you feel weak or unsteady. °· Return to your normal activities as told by your health care provider. Ask your health care provider what activities are safe for you. °General instructions °· Do not take baths, swim, or use a hot tub until your health care provider approves. Ask your health care provider if you may take showers. You may only be allowed to take sponge baths. °· Have someone help you with your daily household tasks for the first few days. °· Keep all follow-up visits as told by your health care provider. This is important. °Contact a health care provider if: °· You have redness, swelling, or pain around your incision. °· Your incision feels warm to the touch. °· You have pus or a bad smell coming from your incision. °· The edges of your incision break open after the sutures have been removed. °· Your pain does not improve after 2-3 days. °· You have a rash. °· You repeatedly become dizzy or light-headed. °· Your pain medicine is not helping. °Get help right away if you: °· Have a fever. °· Faint. °· Have increasing   pain in your abdomen. °· Have severe pain in one or both of your shoulders. °· Have fluid or blood coming from your sutures or from your vagina. °· Have shortness of breath or difficulty breathing. °· Have chest pain or leg pain. °· Have ongoing nausea, vomiting, or diarrhea. °Summary °· After the procedure, it is common to have mild discomfort or cramping in your abdomen. °· Take over-the-counter and prescription medicines only as told by your health care provider. °· Watch for symptoms that should  prompt you to call your health care provider. °· Keep all follow-up visits as told by your health care provider. This is important. °This information is not intended to replace advice given to you by your health care provider. Make sure you discuss any questions you have with your health care provider. °Document Released: 01/10/2005 Document Revised: 05/18/2018 Document Reviewed: 05/18/2018 °Elsevier Patient Education © 2020 [employer] ° °

## 2019-05-12 ENCOUNTER — Encounter (HOSPITAL_COMMUNITY): Payer: Self-pay | Admitting: Obstetrics & Gynecology

## 2019-05-12 LAB — SURGICAL PATHOLOGY

## 2019-05-16 ENCOUNTER — Telehealth: Payer: Self-pay | Admitting: Obstetrics & Gynecology

## 2019-05-16 NOTE — Telephone Encounter (Signed)

## 2019-05-17 ENCOUNTER — Other Ambulatory Visit: Payer: Self-pay

## 2019-05-17 ENCOUNTER — Encounter: Payer: Self-pay | Admitting: Obstetrics & Gynecology

## 2019-05-17 ENCOUNTER — Ambulatory Visit (INDEPENDENT_AMBULATORY_CARE_PROVIDER_SITE_OTHER): Payer: Managed Care, Other (non HMO) | Admitting: Obstetrics & Gynecology

## 2019-05-17 VITALS — BP 112/83 | HR 83 | Ht 65.0 in | Wt 248.0 lb

## 2019-05-17 DIAGNOSIS — Z9889 Other specified postprocedural states: Secondary | ICD-10-CM

## 2019-05-17 NOTE — Progress Notes (Signed)
  HPI: Patient returns for routine postoperative follow-up having undergone see op note on 05/11/2019.  The patient's immediate postoperative recovery has been unremarkable. Since hospital discharge the patient reports no problems.   Current Outpatient Medications: hydrochlorothiazide (HYDRODIURIL) 25 MG tablet, TAKE 1 TABLET DAILY AS NEEDED FOR SWELLING (Patient taking differently: Take 25 mg by mouth daily as needed (swelling). ), Disp: 90 tablet, Rfl: 0  No current facility-administered medications for this visit.     Blood pressure 112/83, pulse 83, height 5\' 5"  (1.651 m), weight 248 lb (112.5 kg).  Physical Exam: Incisions x 3 clean dry intact Abdomen with some normal bruising otherwise benign  Diagnostic Tests:   Pathology: benign  Impression: S/p laparoscopic salpingectomy for sterilization S/p failed ablation which I think is due to doing the salpingectomy first which causes the CO2 to escape throught the tubal openings  Plan:  4 months follow up  Follow up:     Florian Buff, MD

## 2019-05-19 ENCOUNTER — Encounter: Payer: Managed Care, Other (non HMO) | Admitting: Obstetrics & Gynecology

## 2019-09-15 ENCOUNTER — Ambulatory Visit: Payer: Managed Care, Other (non HMO) | Admitting: Obstetrics & Gynecology

## 2019-09-26 ENCOUNTER — Ambulatory Visit: Payer: Managed Care, Other (non HMO) | Admitting: Obstetrics & Gynecology

## 2019-09-28 ENCOUNTER — Telehealth: Payer: Self-pay | Admitting: Obstetrics & Gynecology

## 2019-09-28 NOTE — Telephone Encounter (Signed)

## 2019-09-29 ENCOUNTER — Other Ambulatory Visit: Payer: Self-pay

## 2019-09-29 ENCOUNTER — Ambulatory Visit (INDEPENDENT_AMBULATORY_CARE_PROVIDER_SITE_OTHER): Payer: 59 | Admitting: Obstetrics & Gynecology

## 2019-09-29 ENCOUNTER — Encounter: Payer: Self-pay | Admitting: Obstetrics & Gynecology

## 2019-09-29 VITALS — BP 122/84 | HR 80 | Ht 65.0 in | Wt 247.0 lb

## 2019-09-29 DIAGNOSIS — Z9889 Other specified postprocedural states: Secondary | ICD-10-CM

## 2019-09-29 NOTE — Progress Notes (Signed)
  HPI: Patient returns for routine postoperative follow-up having undergone laparoscopic bil salpingectomy with endometrial ablation on 11/20.  The patient's immediate postoperative recovery has been unremarkable. Since hospital discharge the patient reports no problems.   Current Outpatient Medications: .  hydrochlorothiazide (HYDRODIURIL) 25 MG tablet, TAKE 1 TABLET DAILY AS NEEDED FOR SWELLING (Patient taking differently: Take 25 mg by mouth daily as needed (swelling). ), Disp: 90 tablet, Rfl: 0  No current facility-administered medications for this visit.    Blood pressure 122/84, pulse 80, height 5\' 5"  (1.651 m), weight 247 lb (112 kg).   Physical Exam: norml exam  Diagnostic Tests:   Pathology: benign  Impression: Late post op from laparoscopic salpingectomy and endometrial ablation  Plan:   Follow up: Prn yearly    , MD

## 2019-10-06 ENCOUNTER — Other Ambulatory Visit: Payer: Managed Care, Other (non HMO) | Admitting: Adult Health

## 2021-06-07 ENCOUNTER — Other Ambulatory Visit: Payer: Self-pay | Admitting: General Surgery

## 2021-11-01 ENCOUNTER — Ambulatory Visit: Payer: 59 | Admitting: Nurse Practitioner

## 2021-11-12 ENCOUNTER — Ambulatory Visit (INDEPENDENT_AMBULATORY_CARE_PROVIDER_SITE_OTHER): Payer: 59 | Admitting: Nurse Practitioner

## 2021-11-12 ENCOUNTER — Encounter: Payer: Self-pay | Admitting: Nurse Practitioner

## 2021-11-12 VITALS — BP 117/76 | HR 73 | Temp 97.6°F | Ht 65.0 in | Wt 263.4 lb

## 2021-11-12 DIAGNOSIS — Z7689 Persons encountering health services in other specified circumstances: Secondary | ICD-10-CM

## 2021-11-12 DIAGNOSIS — E6609 Other obesity due to excess calories: Secondary | ICD-10-CM | POA: Diagnosis not present

## 2021-11-12 DIAGNOSIS — Z0000 Encounter for general adult medical examination without abnormal findings: Secondary | ICD-10-CM

## 2021-11-12 DIAGNOSIS — Z23 Encounter for immunization: Secondary | ICD-10-CM

## 2021-11-12 DIAGNOSIS — Z0001 Encounter for general adult medical examination with abnormal findings: Secondary | ICD-10-CM

## 2021-11-12 MED ORDER — SEMAGLUTIDE-WEIGHT MANAGEMENT 1 MG/0.5ML ~~LOC~~ SOAJ: 1.0000 mg | SUBCUTANEOUS | 0 refills | Status: AC

## 2021-11-12 MED ORDER — SEMAGLUTIDE-WEIGHT MANAGEMENT 1.7 MG/0.75ML ~~LOC~~ SOAJ: 1.7000 mg | SUBCUTANEOUS | 0 refills | Status: DC

## 2021-11-12 MED ORDER — SEMAGLUTIDE-WEIGHT MANAGEMENT 2.4 MG/0.75ML ~~LOC~~ SOAJ: 2.4000 mg | SUBCUTANEOUS | 0 refills | Status: DC

## 2021-11-12 MED ORDER — SEMAGLUTIDE-WEIGHT MANAGEMENT 0.5 MG/0.5ML ~~LOC~~ SOAJ: 0.5000 mg | SUBCUTANEOUS | 0 refills | Status: AC

## 2021-11-12 MED ORDER — SEMAGLUTIDE-WEIGHT MANAGEMENT 0.25 MG/0.5ML ~~LOC~~ SOAJ: 0.2500 mg | SUBCUTANEOUS | 0 refills | Status: AC

## 2021-11-12 NOTE — Progress Notes (Signed)
? ?New Patient Note ? ?RE: Meghan Ruiz MRN: [id number] DOB: 04-08-1976 ?Date of Office Visit: 11/12/2021 ? ?Chief Complaint: Establish Care (New PCP) and Obesity ? ?History of Present Illness: ?.   ?Encounter for general adult medical examination:  ?Physical: Patient's last physical exam was 1 year ago .  ?Weight: Appropriate for height (BMI greater than 27%) ;  ?Blood Pressure: Normal (BP less than 120/80) ;  ?Medical History: Patient history reviewed ; Family history reviewed ;  ?Allergies Reviewed: No change in current allergies ;  ?Medications Reviewed: Medications reviewed - no changes ;  ?Lipids: Normal lipid levels ; labs completed results pending ?Smoking: Life-long non-smoker ;  ?Physical Activity: Exercises at least 3 times per week ;  ?Alcohol/Drug Use: Is a non-drinker ; No illicit drug use ;  ?Patient is not afflicted from Stress Incontinence and Urge Incontinence  ?Safety: reviewed ; Patient wears a seat belt, has smoke detectors, has carbon monoxide detectors, and wears sunscreen with extended sun exposure. ?Dental Care: biannual cleanings, brushes and flosses daily. ?Ophthalmology/Optometry: Annual visit.  ?Hearing loss: none ?Vision impairments: none  ? ?Assessment and Plan: ?Meghan Ruiz is a 46 y.o. female with: ?Obesity ?Completed assessment.  Patient will benefit from weight loss medication.  Education provided printed handouts given.  Recommendation diet and exercise.  Follow-up in 3 months. ? ?Encounter to establish care with new doctor ?Patient is here establishing care today.  Completed head to toe assessment.  Provided education to patient on health maintenance and preventative care.  Printed handouts given.  Labs completed-CBC, CMP, lipid panel, hepatitisC.  Results pending. ? ?Patient is scheduled to complete mammogram in a few weeks, completed Tdap today. ? ?Return if symptoms worsen or fail to improve. ? ? ?Diagnostics: ? ? ?Past Medical History: ?Patient Active Problem List  ? Diagnosis  Date Noted  ? Encounter to establish care with new doctor 04/14/2019  ? Encounter for gynecological examination with Papanicolaou smear of cervix 08/13/2018  ? Surveillance for Depo-Provera contraception 08/13/2018  ? Negative pregnancy test 08/13/2018  ? Screening for colorectal cancer 08/13/2018  ? Encounter for surveillance of injectable contraceptive 08/10/2017  ? Well woman exam with routine gynecological exam 08/06/2016  ? Abdominal bloating 07/11/2015  ? Weight gain 06/14/2014  ? Foot pain, bilateral 06/08/2013  ? Obesity 06/08/2013  ? Contraceptive management 06/08/2013  ? ?Past Medical History:  ?Diagnosis Date  ? Abdominal bloating 07/11/2015  ? Foot pain, bilateral   ? has heel spurs  ? Medical history non-contributory   ? Obesity 06/08/2013  ? Weight gain 06/14/2014  ? ?Past Surgical History: ?Past Surgical History:  ?Procedure Laterality Date  ? APPENDECTOMY    ? DILATATION AND CURETTAGE/HYSTEROSCOPY WITH MINERVA N/A 05/11/2019  ? Procedure: HYSTEROSCOPY;  Surgeon: [name], MD;  Location: [facility];  Service: Gynecology;  Laterality: N/A;  ? LAPAROSCOPIC BILATERAL SALPINGECTOMY Bilateral 05/11/2019  ? Procedure: LAPAROSCOPIC BILATERAL SALPINGECTOMY;  Surgeon: [name], MD;  Location: [facility];  Service: Gynecology;  Laterality: Bilateral;  ? PILONIDAL CYST EXCISION    ? TONSILLECTOMY    ? TUBAL LIGATION  2019  ? ?Medication List:  ?No current outpatient medications on file.  ? ?No current facility-administered medications for this visit.  ? ?Allergies: ?Allergies  ?Allergen Reactions  ? Shellfish Allergy Swelling  ? ?Social History: ?Social History  ? ?Socioeconomic History  ? Marital status: Married  ?  Spouse name: Meghan Ruiz  ? Number of children: 3  ? Years of education: Not on file  ?  Highest education level: Not on file  ?Occupational History  ? Not on file  ?Tobacco Use  ? Smoking status: Never  ? Smokeless tobacco: Never  ?Vaping Use  ? Vaping Use: Never used  ?Substance and Sexual Activity   ? Alcohol use: No  ? Drug use: No  ? Sexual activity: Yes  ?  Birth control/protection: Surgical  ?  Comment: tubal  ?Other Topics Concern  ? Not on file  ?Social History Narrative  ? Not on file  ? ?Social Determinants of Health  ? ?Financial Resource Strain: Not on file  ?Food Insecurity: Not on file  ?Transportation Needs: Not on file  ?Physical Activity: Not on file  ?Stress: Not on file  ?Social Connections: Not on file  ? ? ? ? ? ?Family History: ?Family History  ?Problem Relation Age of Onset  ? Obesity Mother   ? Cancer Mother   ? Heart disease Mother   ? Hypertension Father   ? Other Daughter   ?     brachial plexis  ? Cancer Paternal Uncle   ?     lymph/brain  ? Hepatitis C Maternal Grandmother   ? Diabetes Paternal Grandmother   ? Hypertension Paternal Grandfather   ? ?     ? ?Review of Systems  ?Constitutional: Negative.   ?HENT: Negative.    ?Eyes: Negative.   ?Respiratory: Negative.    ?Cardiovascular: Negative.   ?Gastrointestinal: Negative.   ?Musculoskeletal: Negative.   ?Skin:  Negative for rash.  ?Neurological: Negative.   ?Psychiatric/Behavioral: Negative.    ?All other systems reviewed and are negative. ?Objective: ?BP 117/76   Pulse 73   Temp 97.6 ?F (36.4 ?C)   Ht 5\' 5"  (1.651 m)   Wt 263 lb 6.4 oz (119.5 kg)   SpO2 98%   BMI 43.83 kg/m?  ?Body mass index is 43.83 kg/m? ?Physical Exam ?Vitals reviewed.  ?Constitutional:   ?   Appearance: Normal appearance. She is obese.  ?HENT:  ?   Head: Normocephalic.  ?   Right Ear: External ear normal.  ?   Left Ear: External ear normal.  ?   Nose: Nose normal.  ?   Mouth/Throat:  ?   Mouth: Mucous membranes are moist.  ?   Pharynx: Oropharynx is clear.  ?Cardiovascular:  ?   Rate and Rhythm: Normal rate and regular rhythm.  ?   Pulses: Normal pulses.  ?   Heart sounds: Normal heart sounds.  ?Pulmonary:  ?   Effort: Pulmonary effort is normal.  ?   Breath sounds: Normal breath sounds.  ?Abdominal:  ?   General: Bowel sounds are normal.   ?Musculoskeletal:     ?   General: Normal range of motion.  ?Skin: ?   General: Skin is warm.  ?   Findings: No rash.  ?Neurological:  ?   General: No focal deficit present.  ?   Mental Status: She is alert and oriented to person, place, and time.  ? ?The plan was reviewed with the patient/family, and all questions/concerned were addressed. ? ?It was my pleasure to see Temesha today and participate in her care. Please feel free to contact me with any questions or concerns. ? ?Sincerely, ? ?Neysa Bonito NP ?[facility] ?

## 2021-11-12 NOTE — Assessment & Plan Note (Addendum)
Completed assessment.  Patient will benefit from weight loss medication.  Patient diet and exercise and weight loss for 6 months with no effects.  Patient continues to gain weight and current BMI is 43.3 kg a.m.  I will start patient on Wegovy. education provided printed handouts given.  Recommendation continue diet and exercise.  Follow-up in 3 months. ?Lab work completed CBC, CMP lipid panel and TSH. ?

## 2021-11-12 NOTE — Addendum Note (Signed)
Addended by: Ivy Lynn on: 11/12/2021 04:09 PM ? ? Modules accepted: Orders ? ?

## 2021-11-12 NOTE — Assessment & Plan Note (Addendum)
Patient is here establishing care today.  Completed head to toe assessment.  Provided education to patient on health maintenance and preventative care.  Printed handouts given.  Labs completed-CBC, CMP, lipid panel, hepatitisC.  Results pending. ? ?Patient is scheduled to complete mammogram in a few weeks, completed Tdap today. ? ?

## 2021-11-12 NOTE — Patient Instructions (Signed)
Here is a guide to help us find out which weight loss medications will be covered by your insurance plan.  Please check out this web site  [email or web address] and follow the 3 simple steps.   There is also a phone number you can call if you do not have access to the Internet. [phone] (Monday- Friday 8am-8pm)  [employer] provides coverage information for more than 80% of the inquiries submitted!!   Health Maintenance, Female Adopting a healthy lifestyle and getting preventive care are important in promoting health and wellness. Ask your health care provider about: The right schedule for you to have regular tests and exams. Things you can do on your own to prevent diseases and keep yourself healthy. What should I know about diet, weight, and exercise? Eat a healthy diet  Eat a diet that includes plenty of vegetables, fruits, low-fat dairy products, and lean protein. Do not eat a lot of foods that are high in solid fats, added sugars, or sodium. Maintain a healthy weight Body mass index (BMI) is used to identify weight problems. It estimates body fat based on height and weight. Your health care provider can help determine your BMI and help you achieve or maintain a healthy weight. Get regular exercise Get regular exercise. This is one of the most important things you can do for your health. Most adults should: Exercise for at least 150 minutes each week. The exercise should increase your heart rate and make you sweat (moderate-intensity exercise). Do strengthening exercises at least twice a week. This is in addition to the moderate-intensity exercise. Spend less time sitting. Even light physical activity can be beneficial. Watch cholesterol and blood lipids Have your blood tested for lipids and cholesterol at 44 years of age, then have this test every 5 years. Have your cholesterol levels checked more often if: Your lipid or cholesterol levels are high. You are older than 44 years of  age. You are at high risk for heart disease. What should I know about cancer screening? Depending on your health history and family history, you may need to have cancer screening at various ages. This may include screening for: Breast cancer. Cervical cancer. Colorectal cancer. Skin cancer. Lung cancer. What should I know about heart disease, diabetes, and high blood pressure? Blood pressure and heart disease High blood pressure causes heart disease and increases the risk of stroke. This is more likely to develop in people who have high blood pressure readings or are overweight. Have your blood pressure checked: Every 3-5 years if you are 18-39 years of age. Every year if you are 40 years old or older. Diabetes Have regular diabetes screenings. This checks your fasting blood sugar level. Have the screening done: Once every three years after age 40 if you are at a normal weight and have a low risk for diabetes. More often and at a younger age if you are overweight or have a high risk for diabetes. What should I know about preventing infection? Hepatitis B If you have a higher risk for hepatitis B, you should be screened for this virus. Talk with your health care provider to find out if you are at risk for hepatitis B infection. Hepatitis C Testing is recommended for: Everyone born from 1945 through 1965. Anyone with known risk factors for hepatitis C. Sexually transmitted infections (STIs) Get screened for STIs, including gonorrhea and chlamydia, if: You are sexually active and are younger than 44 years of age. You are older than 44 years of   age and your health care provider tells you that you are at risk for this type of infection. Your sexual activity has changed since you were last screened, and you are at increased risk for chlamydia or gonorrhea. Ask your health care provider if you are at risk. Ask your health care provider about whether you are at high risk for HIV. Your health  care provider may recommend a prescription medicine to help prevent HIV infection. If you choose to take medicine to prevent HIV, you should first get tested for HIV. You should then be tested every 3 months for as long as you are taking the medicine. Pregnancy If you are about to stop having your period (premenopausal) and you may become pregnant, seek counseling before you get pregnant. Take 400 to 800 micrograms (mcg) of folic acid every day if you become pregnant. Ask for birth control (contraception) if you want to prevent pregnancy. Osteoporosis and menopause Osteoporosis is a disease in which the bones lose minerals and strength with aging. This can result in bone fractures. If you are 65 years old or older, or if you are at risk for osteoporosis and fractures, ask your health care provider if you should: Be screened for bone loss. Take a calcium or vitamin D supplement to lower your risk of fractures. Be given hormone replacement therapy (HRT) to treat symptoms of menopause. Follow these instructions at home: Alcohol use Do not drink alcohol if: Your health care provider tells you not to drink. You are pregnant, may be pregnant, or are planning to become pregnant. If you drink alcohol: Limit how much you have to: 0-1 drink a day. Know how much alcohol is in your drink. In the [place] one drink equals one 12 oz bottle of beer (355 mL), one 5 oz glass of wine (148 mL), or one 1 oz glass of hard liquor (44 mL). Lifestyle Do not use any products that contain nicotine or tobacco. These products include cigarettes, chewing tobacco, and vaping devices, such as e-cigarettes. If you need help quitting, ask your health care provider. Do not use street drugs. Do not share needles. Ask your health care provider for help if you need support or information about quitting drugs. General instructions Schedule regular health, dental, and eye exams. Stay current with your vaccines. Tell your health  care provider if: You often feel depressed. You have ever been abused or do not feel safe at home. Summary Adopting a healthy lifestyle and getting preventive care are important in promoting health and wellness. Follow your health care provider's instructions about healthy diet, exercising, and getting tested or screened for diseases. Follow your health care provider's instructions on monitoring your cholesterol and blood pressure. This information is not intended to replace advice given to you by your health care provider. Make sure you discuss any questions you have with your health care provider. Document Revised: 11/12/2020 Document Reviewed: 11/12/2020 Elsevier Patient Education  2023 Elsevier Inc.  

## 2021-11-12 NOTE — Addendum Note (Signed)
Addended bySigurd Sos on: 11/12/2021 03:46 PM ? ? Modules accepted: Orders ? ?

## 2021-11-13 ENCOUNTER — Other Ambulatory Visit: Payer: 59

## 2021-11-13 ENCOUNTER — Telehealth: Payer: Self-pay | Admitting: Nurse Practitioner

## 2021-11-13 ENCOUNTER — Other Ambulatory Visit: Payer: Self-pay

## 2021-11-13 DIAGNOSIS — Z0000 Encounter for general adult medical examination without abnormal findings: Secondary | ICD-10-CM

## 2021-11-14 LAB — COMPREHENSIVE METABOLIC PANEL
ALT: 77 IU/L — ABNORMAL HIGH (ref 0–32)
AST: 43 IU/L — ABNORMAL HIGH (ref 0–40)
Albumin/Globulin Ratio: 2 (ref 1.2–2.2)
Albumin: 4.2 g/dL (ref 3.8–4.8)
Alkaline Phosphatase: 82 IU/L (ref 44–121)
BUN/Creatinine Ratio: 15 (ref 9–23)
BUN: 12 mg/dL (ref 6–24)
Bilirubin Total: 1.5 mg/dL — ABNORMAL HIGH (ref 0.0–1.2)
CO2: 23 mmol/L (ref 20–29)
Calcium: 8.9 mg/dL (ref 8.7–10.2)
Chloride: 106 mmol/L (ref 96–106)
Creatinine, Ser: 0.79 mg/dL (ref 0.57–1.00)
Globulin, Total: 2.1 g/dL (ref 1.5–4.5)
Glucose: 105 mg/dL — ABNORMAL HIGH (ref 70–99)
Potassium: 4.4 mmol/L (ref 3.5–5.2)
Sodium: 141 mmol/L (ref 134–144)
Total Protein: 6.3 g/dL (ref 6.0–8.5)
eGFR: 95 mL/min/{1.73_m2} (ref >59)

## 2021-11-14 LAB — CBC WITH DIFFERENTIAL/PLATELET
Basophils Absolute: 0 10*3/uL (ref 0.0–0.2)
Basos: 1 %
EOS (ABSOLUTE): 0.2 10*3/uL (ref 0.0–0.4)
Eos: 3 %
Hematocrit: 44.2 % (ref 34.0–46.6)
Hemoglobin: 14.5 g/dL (ref 11.1–15.9)
Immature Grans (Abs): 0 10*3/uL (ref 0.0–0.1)
Immature Granulocytes: 0 %
Lymphocytes Absolute: 2.2 10*3/uL (ref 0.7–3.1)
Lymphs: 40 %
MCH: 28.2 pg (ref 26.6–33.0)
MCHC: 32.8 g/dL (ref 31.5–35.7)
MCV: 86 fL (ref 79–97)
Monocytes Absolute: 0.4 10*3/uL (ref 0.1–0.9)
Monocytes: 7 %
Neutrophils Absolute: 2.7 10*3/uL (ref 1.4–7.0)
Neutrophils: 49 %
Platelets: 231 10*3/uL (ref 150–450)
RBC: 5.14 x10E6/uL (ref 3.77–5.28)
RDW: 12.3 % (ref 11.7–15.4)
WBC: 5.5 10*3/uL (ref 3.4–10.8)

## 2021-11-14 LAB — HEPATITIS C ANTIBODY: Hep C Virus Ab: NONREACTIVE

## 2021-11-14 LAB — LIPID PANEL
Chol/HDL Ratio: 3.8 ratio (ref 0.0–4.4)
Cholesterol, Total: 168 mg/dL (ref 100–199)
HDL: 44 mg/dL (ref >39)
LDL Chol Calc (NIH): 107 mg/dL — ABNORMAL HIGH (ref 0–99)
Triglycerides: 93 mg/dL (ref 0–149)
VLDL Cholesterol Cal: 17 mg/dL (ref 5–40)

## 2021-11-15 LAB — SPECIMEN STATUS REPORT

## 2021-11-15 LAB — TSH: TSH: 2.3 u[IU]/mL (ref 0.450–4.500)

## 2021-11-15 NOTE — Telephone Encounter (Signed)
Your prior authorization for [facility] has been approved! ?MORE INFO ?Personalized support and financial assistance may be available through the [facility] [employer] program. For more information, and to see program requirements, click on the More Info button to the right. ? ?Message from plan: CaseId:[id number];Status:Approved;Review Type:Prior Auth;Coverage Start Date:10/16/2021;Coverage End Date:06/13/2022; ? ?Pharmacy and patient aware. ?

## 2021-11-15 NOTE — Telephone Encounter (Signed)
(  Key: [id number]) ?XHBZJI 0.25MG /0.5ML auto-injectors ? ?PA in process ?

## 2021-11-29 ENCOUNTER — Other Ambulatory Visit: Payer: Self-pay | Admitting: Nurse Practitioner

## 2021-11-29 DIAGNOSIS — Z0000 Encounter for general adult medical examination without abnormal findings: Secondary | ICD-10-CM

## 2021-12-09 ENCOUNTER — Encounter: Payer: Self-pay | Admitting: *Deleted

## 2021-12-11 ENCOUNTER — Ambulatory Visit
Admission: RE | Admit: 2021-12-11 | Discharge: 2021-12-11 | Disposition: A | Discharge status: Home / Self Care | Payer: 59 | Source: Ambulatory Visit | Attending: Nurse Practitioner | Admitting: Nurse Practitioner

## 2021-12-13 ENCOUNTER — Other Ambulatory Visit: Payer: Self-pay | Admitting: Nurse Practitioner

## 2021-12-13 DIAGNOSIS — R928 Other abnormal and inconclusive findings on diagnostic imaging of breast: Secondary | ICD-10-CM

## 2022-01-06 ENCOUNTER — Ambulatory Visit
Admission: RE | Admit: 2022-01-06 | Discharge: 2022-01-06 | Disposition: A | Discharge status: Home / Self Care | Payer: 59 | Source: Ambulatory Visit | Attending: Nurse Practitioner | Admitting: Nurse Practitioner

## 2022-01-06 DIAGNOSIS — R928 Other abnormal and inconclusive findings on diagnostic imaging of breast: Secondary | ICD-10-CM

## 2022-02-14 ENCOUNTER — Ambulatory Visit: Payer: 59 | Admitting: Nurse Practitioner

## 2022-02-17 ENCOUNTER — Ambulatory Visit: Payer: 59 | Admitting: Nurse Practitioner

## 2022-02-24 ENCOUNTER — Ambulatory Visit: Payer: 59 | Admitting: Nurse Practitioner

## 2022-02-26 ENCOUNTER — Encounter: Payer: Self-pay | Admitting: Nurse Practitioner

## 2022-02-26 ENCOUNTER — Ambulatory Visit (INDEPENDENT_AMBULATORY_CARE_PROVIDER_SITE_OTHER): Payer: 59 | Admitting: Nurse Practitioner

## 2022-02-26 VITALS — BP 122/85 | HR 88 | Temp 98.4°F | Ht 65.0 in | Wt 249.0 lb

## 2022-02-26 DIAGNOSIS — L089 Local infection of the skin and subcutaneous tissue, unspecified: Secondary | ICD-10-CM | POA: Diagnosis not present

## 2022-02-26 DIAGNOSIS — E6609 Other obesity due to excess calories: Secondary | ICD-10-CM | POA: Diagnosis not present

## 2022-02-26 MED ORDER — DOXYCYCLINE HYCLATE 100 MG PO TABS: 100.0000 mg | ORAL_TABLET | Freq: Two times a day (BID) | ORAL | 0 refills | Status: DC

## 2022-02-26 NOTE — Patient Instructions (Signed)
Obesity, Adult ?Obesity is having too much body fat. Being obese means that your weight is more than what is healthy for you.  ?BMI (body mass index) is a number that explains how much body fat you have. If you have a BMI of 30 or more, you are obese. ?Obesity can cause serious health problems, such as: ?Stroke. ?Coronary artery disease (CAD). ?Type 2 diabetes. ?Some types of cancer. ?High blood pressure (hypertension). ?High cholesterol. ?Gallbladder stones. ?Obesity can also contribute to: ?Osteoarthritis. ?Sleep apnea. ?Infertility problems. ?What are the causes? ?Eating meals each day that are high in calories, sugar, and fat. ?Drinking a lot of drinks that have sugar in them. ?Being born with genes that may make you more likely to become obese. ?Having a medical condition that causes obesity. ?Taking certain medicines. ?Sitting a lot (having a sedentary lifestyle). ?Not getting enough sleep. ?What increases the risk? ?Having a family history of obesity. ?Living in an area with limited access to: ?Parks, recreation centers, or sidewalks. ?Healthy food choices, such as grocery stores and farmers' markets. ?What are the signs or symptoms? ?The main sign is having too much body fat. ?How is this treated? ?Treatment for this condition often includes changing your lifestyle. Treatment may include: ?Changing your diet. This may include making a healthy meal plan. ?Exercise. This may include activity that causes your heart to beat faster (aerobic exercise) and strength training. Work with your doctor to design a program that works for you. ?Medicine to help you lose weight. This may be used if you are not able to lose one pound a week after 6 weeks of healthy eating and more exercise. ?Treating conditions that cause the obesity. ?Surgery. Options may include gastric banding and gastric bypass. This may be done if: ?Other treatments have not helped to improve your condition. ?You have a BMI of 40 or higher. ?You have  life-threatening health problems related to obesity. ?Follow these instructions at home: ?Eating and drinking ? ?Follow advice from your doctor about what to eat and drink. Your doctor may tell you to: ?Limit fast food, sweets, and processed snack foods. ?Choose low-fat options. For example, choose low-fat milk instead of whole milk. ?Eat five or more servings of fruits or vegetables each day. ?Eat at home more often. This gives you more control over what you eat. ?Choose healthy foods when you eat out. ?Learn to read food labels. This will help you learn how much food is in one serving. ?Keep low-fat snacks available. ?Avoid drinks that have a lot of sugar in them. These include soda, fruit juice, iced tea with sugar, and flavored milk. ?Drink enough water to keep your pee (urine) pale yellow. ?Do not go on fad diets. ?Physical activity ?Exercise often, as told by your doctor. Most adults should get up to 150 minutes of moderate-intensity exercise every week.Ask your doctor: ?What types of exercise are safe for you. ?How often you should exercise. ?Warm up and stretch before being active. ?Do slow stretching after being active (cool down). ?Rest between times of being active. ?Lifestyle ?Work with your doctor and a food expert (dietitian) to set a weight-loss goal that is best for you. ?Limit your screen time. ?Find ways to reward yourself that do not involve food. ?Do not drink alcohol if: ?Your doctor tells you not to drink. ?You are pregnant, may be pregnant, or are planning to become pregnant. ?If you drink alcohol: ?Limit how much you have to: ?0-1 drink a day for women. ?0-2 drinks   a day for men. ?Know how much alcohol is in your drink. In the [place] one drink equals one 12 oz bottle of beer (355 mL), one 5 oz glass of wine (148 mL), or one 1? oz glass of hard liquor (44 mL). ?General instructions ?Keep a weight-loss journal. This can help you keep track of: ?The food that you eat. ?How much exercise you  get. ?Take over-the-counter and prescription medicines only as told by your doctor. ?Take vitamins and supplements only as told by your doctor. ?Think about joining a support group. ?Pay attention to your mental health as obesity can lead to depression or self esteem issues. ?Keep all follow-up visits. ?Contact a doctor if: ?You cannot meet your weight-loss goal after you have changed your diet and lifestyle for 6 weeks. ?You are having trouble breathing. ?Summary ?Obesity is having too much body fat. ?Being obese means that your weight is more than what is healthy for you. ?Work with your doctor to set a weight-loss goal. ?Get regular exercise as told by your doctor. ?This information is not intended to replace advice given to you by your health care provider. Make sure you discuss any questions you have with your health care provider. ?Document Revised: 01/29/2021 Document Reviewed: 01/29/2021 ?Elsevier Patient Education ? 2023 [employer]. ? ?

## 2022-02-26 NOTE — Progress Notes (Signed)
Established Patient Office Visit  Subjective   Patient ID: Meghan Ruiz, female    DOB: 05-11-1976  Age: 46 y.o. MRN: [id number]  Chief Complaint  Patient presents with   Medical Management of Chronic Issues    3 month    HPI  Patient is a 46 year old female who presents to clinic for follow-up of weight loss.  She is currently on semaglutide 0.5 mg subcu weekly.  Patient has lost a total of 14 pounds.  He is compliant with medication and has no complaints of side effects.  She is following up as scheduled.   Patient developed skin infection on her left leg after being hit by  work equipment over 3 to 4 months ago.  Wound is scabbed over but still hurt with mild to moderate tenderness with movement and palpation.  Patient reports that every once in a while wound will have clear discharge and irritation.      Review of Systems  Constitutional: Negative.  Negative for chills and fever.  HENT: Negative.    Eyes: Negative.   Respiratory: Negative.    Cardiovascular: Negative.   Genitourinary: Negative.   Skin:  Positive for itching and rash.  Neurological: Negative.   All other systems reviewed and are negative.     Objective:     BP 122/85   Pulse 88   Temp 98.4 F (36.9 C)   Ht 5' 5"  (1.651 m)   Wt 249 lb (112.9 kg)   SpO2 100%   BMI 41.44 kg/m  BP Readings from Last 3 Encounters:  02/26/22 122/85  11/12/21 117/76  09/29/19 122/84   Wt Readings from Last 3 Encounters:  02/26/22 249 lb (112.9 kg)  11/12/21 263 lb 6.4 oz (119.5 kg)  09/29/19 247 lb (112 kg)      Physical Exam Vitals and nursing note reviewed.  Constitutional:      Appearance: Normal appearance. She is obese.  HENT:     Head: Normocephalic.     Right Ear: External ear normal.     Left Ear: External ear normal.     Nose: Nose normal.     Mouth/Throat:     Mouth: Mucous membranes are moist.     Pharynx: Oropharynx is clear.  Eyes:     Conjunctiva/sclera: Conjunctivae normal.   Cardiovascular:     Rate and Rhythm: Normal rate and regular rhythm.     Pulses: Normal pulses.     Heart sounds: Normal heart sounds.  Pulmonary:     Effort: Pulmonary effort is normal.     Breath sounds: Normal breath sounds.  Abdominal:     General: Bowel sounds are normal.  Musculoskeletal:        General: Normal range of motion.  Skin:    General: Skin is warm.     Findings: Erythema and rash present.          Comments: Rash , redness and tenderness present    Neurological:     General: No focal deficit present.     Mental Status: She is alert and oriented to person, place, and time.  Psychiatric:        Mood and Affect: Mood normal.        Behavior: Behavior normal.      No results found for any visits on 02/26/22.  Last CBC Lab Results  Component Value Date   WBC 5.5 11/13/2021   HGB 14.5 11/13/2021   HCT 44.2 11/13/2021   MCV 86 11/13/2021  MCH 28.2 11/13/2021   RDW 12.3 11/13/2021   PLT 231 82/95/6213   Last metabolic panel Lab Results  Component Value Date   GLUCOSE 105 (H) 11/13/2021   NA 141 11/13/2021   K 4.4 11/13/2021   CL 106 11/13/2021   CO2 23 11/13/2021   BUN 12 11/13/2021   CREATININE 0.79 11/13/2021   EGFR 95 11/13/2021   CALCIUM 8.9 11/13/2021   PROT 6.3 11/13/2021   ALBUMIN 4.2 11/13/2021   LABGLOB 2.1 11/13/2021   AGRATIO 2.0 11/13/2021   BILITOT 1.5 (H) 11/13/2021   ALKPHOS 82 11/13/2021   AST 43 (H) 11/13/2021   ALT 77 (H) 11/13/2021   ANIONGAP 12 05/09/2019   Last lipids Lab Results  Component Value Date   CHOL 168 11/13/2021   HDL 44 11/13/2021   LDLCALC 107 (H) 11/13/2021   TRIG 93 11/13/2021   CHOLHDL 3.8 11/13/2021   Last hemoglobin A1c No results found for: "HGBA1C" Last thyroid functions Lab Results  Component Value Date   TSH 2.300 11/13/2021       Assessment & Plan:  Patient is losing weight and progressively has lost 66 pounds from last visit.  She is drinking water, exercising and making the  right food choices.  Patient will continue GLP-1 on and move up to next dose of 0.1.  Concerning skin infection started patient on doxycycline 100 mg tablet by mouth twice daily for 5 to 7 days.  Patient knows to look out for signs and symptoms of infection.  Tylenol/ibuprofen for pain as needed.  Follow-up with worsening or resolved symptoms. Problem List Items Addressed This Visit       Other   Obesity - Primary   Relevant Orders   Comprehensive metabolic panel   TSH   Lipid panel   CBC with Differential/Platelet   Other Visit Diagnoses     Skin infection       Relevant Medications   doxycycline (VIBRA-TABS) 100 MG tablet       Return in about 3 months (around 05/29/2022) for weight loss.    Ivy Lynn, NP

## 2022-02-27 LAB — COMPREHENSIVE METABOLIC PANEL
ALT: 53 IU/L — ABNORMAL HIGH (ref 0–32)
AST: 29 IU/L (ref 0–40)
Albumin/Globulin Ratio: 2.3 — ABNORMAL HIGH (ref 1.2–2.2)
Albumin: 4.4 g/dL (ref 3.9–4.9)
Alkaline Phosphatase: 80 IU/L (ref 44–121)
BUN/Creatinine Ratio: 10 (ref 9–23)
BUN: 8 mg/dL (ref 6–24)
Bilirubin Total: 1.9 mg/dL — ABNORMAL HIGH (ref 0.0–1.2)
CO2: 19 mmol/L — ABNORMAL LOW (ref 20–29)
Calcium: 9 mg/dL (ref 8.7–10.2)
Chloride: 105 mmol/L (ref 96–106)
Creatinine, Ser: 0.81 mg/dL (ref 0.57–1.00)
Globulin, Total: 1.9 g/dL (ref 1.5–4.5)
Glucose: 98 mg/dL (ref 70–99)
Potassium: 4.1 mmol/L (ref 3.5–5.2)
Sodium: 141 mmol/L (ref 134–144)
Total Protein: 6.3 g/dL (ref 6.0–8.5)
eGFR: 92 mL/min/{1.73_m2} (ref >59)

## 2022-02-27 LAB — CBC WITH DIFFERENTIAL/PLATELET
Basophils Absolute: 0 10*3/uL (ref 0.0–0.2)
Basos: 1 %
EOS (ABSOLUTE): 0.1 10*3/uL (ref 0.0–0.4)
Eos: 2 %
Hematocrit: 44.9 % (ref 34.0–46.6)
Hemoglobin: 14.8 g/dL (ref 11.1–15.9)
Immature Grans (Abs): 0 10*3/uL (ref 0.0–0.1)
Immature Granulocytes: 0 %
Lymphocytes Absolute: 2.3 10*3/uL (ref 0.7–3.1)
Lymphs: 41 %
MCH: 28.5 pg (ref 26.6–33.0)
MCHC: 33 g/dL (ref 31.5–35.7)
MCV: 87 fL (ref 79–97)
Monocytes Absolute: 0.4 10*3/uL (ref 0.1–0.9)
Monocytes: 7 %
Neutrophils Absolute: 2.7 10*3/uL (ref 1.4–7.0)
Neutrophils: 49 %
Platelets: 231 10*3/uL (ref 150–450)
RBC: 5.19 x10E6/uL (ref 3.77–5.28)
RDW: 12.3 % (ref 11.7–15.4)
WBC: 5.5 10*3/uL (ref 3.4–10.8)

## 2022-02-27 LAB — LIPID PANEL
Chol/HDL Ratio: 3.6 ratio (ref 0.0–4.4)
Cholesterol, Total: 162 mg/dL (ref 100–199)
HDL: 45 mg/dL (ref >39)
LDL Chol Calc (NIH): 105 mg/dL — ABNORMAL HIGH (ref 0–99)
Triglycerides: 59 mg/dL (ref 0–149)
VLDL Cholesterol Cal: 12 mg/dL (ref 5–40)

## 2022-02-27 LAB — TSH: TSH: 1.58 u[IU]/mL (ref 0.450–4.500)

## 2022-03-26 ENCOUNTER — Telehealth: Payer: Self-pay | Admitting: Nurse Practitioner

## 2022-03-26 NOTE — Telephone Encounter (Signed)
Schedule patient office visit for reassessment.

## 2022-03-26 NOTE — Telephone Encounter (Signed)
Patient aware and will schedule appointment.

## 2022-04-01 ENCOUNTER — Ambulatory Visit (INDEPENDENT_AMBULATORY_CARE_PROVIDER_SITE_OTHER): Payer: 59 | Admitting: Nurse Practitioner

## 2022-04-01 ENCOUNTER — Encounter: Payer: Self-pay | Admitting: Nurse Practitioner

## 2022-04-01 VITALS — BP 116/78 | HR 80 | Temp 98.7°F | Ht 65.0 in | Wt 242.6 lb

## 2022-04-01 DIAGNOSIS — R21 Rash and other nonspecific skin eruption: Secondary | ICD-10-CM | POA: Diagnosis not present

## 2022-04-01 MED ORDER — MICONAZOLE NITRATE 2 % EX CREA: 1.0000 | TOPICAL_CREAM | Freq: Two times a day (BID) | CUTANEOUS | 0 refills | Status: DC

## 2022-04-01 NOTE — Progress Notes (Signed)
Acute Office Visit  Subjective:     Patient ID: Meghan Ruiz, female    DOB: 1975-12-06, 46 y.o.   MRN: [id number]  Chief Complaint  Patient presents with   Follow-up    Red spots on legs still has not cleared up    Rash    Sometimes it still itches it has gotten better     Rash This is a recurrent problem. The current episode started 1 to 4 weeks ago. The problem is unchanged. The affected locations include the left lower leg. Associated with: Scratch. Pertinent negatives include no congestion, cough, diarrhea, fatigue or fever. Past treatments include antibiotics. The treatment provided significant relief.    Review of Systems  Constitutional: Negative.  Negative for fatigue and fever.  HENT: Negative.  Negative for congestion.   Respiratory: Negative.  Negative for cough.   Cardiovascular: Negative.   Gastrointestinal:  Negative for diarrhea.  Genitourinary: Negative.   Skin:  Positive for rash.  All other systems reviewed and are negative.       Objective:    BP 116/78   Pulse 80   Temp 98.7 F (37.1 C)   Ht 5\' 5"  (1.651 m)   Wt 242 lb 9.6 oz (110 kg)   LMP 03/24/2022 (Approximate)   SpO2 99%   BMI 40.37 kg/m  BP Readings from Last 3 Encounters:  04/01/22 116/78  02/26/22 122/85  11/12/21 117/76   Wt Readings from Last 3 Encounters:  04/01/22 242 lb 9.6 oz (110 kg)  02/26/22 249 lb (112.9 kg)  11/12/21 263 lb 6.4 oz (119.5 kg)      Physical Exam Vitals and nursing note reviewed.  Constitutional:      Appearance: Normal appearance.  HENT:     Head: Normocephalic.     Right Ear: External ear normal.     Left Ear: External ear normal.     Nose: Nose normal.     Mouth/Throat:     Mouth: Mucous membranes are moist.     Pharynx: Oropharynx is clear.  Cardiovascular:     Rate and Rhythm: Normal rate and regular rhythm.     Pulses: Normal pulses.     Heart sounds: Normal heart sounds.  Pulmonary:     Effort: Pulmonary effort is normal.      Breath sounds: Normal breath sounds.  Abdominal:     General: Bowel sounds are normal.  Skin:    Findings: Erythema and rash present.          Comments: Fungal rash left lower leg  Neurological:     General: No focal deficit present.     Mental Status: She is alert and oriented to person, place, and time.     No results found for any visits on 04/01/22.      Assessment & Plan:  Rash left lower leg, symptoms are improving but not completely resolved.  Started patient on miconazole 2% cream. Avoid scratching with nails, Apply cool compress Follow-up with unresolved symptoms. Problem List Items Addressed This Visit   None Visit Diagnoses     Rash    -  Primary   Relevant Medications   miconazole (MICATIN) 2 % cream       Meds ordered this encounter  Medications   miconazole (MICATIN) 2 % cream    Sig: Apply 1 Application topically 2 (two) times daily.    Dispense:  28.35 g    Refill:  0    Order Specific Question:  Supervising Provider    Answer:   Claretta Fraise [[id number]]    Return if symptoms worsen or fail to improve.  Ivy Lynn, NP

## 2022-04-01 NOTE — Patient Instructions (Signed)
Rash, Adult  A rash is a change in the color of your skin. A rash can also change the way your skin feels. There are many different conditions and factors that can cause a rash. Follow these instructions at home: The goal of treatment is to stop the itching and keep the rash from spreading. Watch for any changes in your symptoms. Let your doctor know about them. Follow these instructions to help with your condition: Medicine Take or apply over-the-counter and prescription medicines only as told by your doctor. These may include medicines: To treat red or swollen skin (corticosteroid creams). To treat itching. To treat an allergy (oral antihistamines). To treat very bad symptoms (oral corticosteroids).  Skin care Put cool cloths (compresses) on the affected areas. Do not scratch or rub your skin. Avoid covering the rash. Make sure that the rash is exposed to air as much as possible. Managing itching and discomfort Avoid hot showers or baths. These can make itching worse. A cold shower may help. Try taking a bath with: Epsom salts. You can get these at your local pharmacy or grocery store. Follow the instructions on the package. Baking soda. Pour a small amount into the bath as told by your doctor. Colloidal oatmeal. You can get this at your local pharmacy or grocery store. Follow the instructions on the package. Try putting baking soda paste onto your skin. Stir water into baking soda until it gets like a paste. Try putting on a lotion that relieves itchiness (calamine lotion). Keep cool and out of the sun. Sweating and being hot can make itching worse. General instructions  Rest as needed. Drink enough fluid to keep your pee (urine) pale yellow. Wear loose-fitting clothing. Avoid scented soaps, detergents, and perfumes. Use gentle soaps, detergents, perfumes, and other cosmetic products. Avoid anything that causes your rash. Keep a journal to help track what causes your rash. Write  down: What you eat. What cosmetic products you use. What you drink. What you wear. This includes jewelry. Keep all follow-up visits as told by your doctor. This is important. Contact a doctor if: You sweat at night. You lose weight. You pee (urinate) more than normal. You pee less than normal, or you notice that your pee is a darker color than normal. You feel weak. You throw up (vomit). Your skin or the whites of your eyes look yellow (jaundice). Your skin: Tingles. Is numb. Your rash: Does not go away after a few days. Gets worse. You are: More thirsty than normal. More tired than normal. You have: New symptoms. Pain in your belly (abdomen). A fever. Watery poop (diarrhea). Get help right away if: You have a fever and your symptoms suddenly get worse. You start to feel mixed up (confused). You have a very bad headache or a stiff neck. You have very bad joint pains or stiffness. You have jerky movements that you cannot control (seizure). Your rash covers all or most of your body. The rash may or may not be painful. You have blisters that: Are on top of the rash. Grow larger. Grow together. Are painful. Are inside your nose or mouth. You have a rash that: Looks like purple pinprick-sized spots all over your body. Has a "bull's eye" or looks like a target. Is red and painful, causes your skin to peel, and is not from being in the sun too long. Summary A rash is a change in the color of your skin. A rash can also change the way your skin   feels. The goal of treatment is to stop the itching and keep the rash from spreading. Take or apply over-the-counter and prescription medicines only as told by your doctor. Contact a doctor if you have new symptoms or symptoms that get worse. Keep all follow-up visits as told by your doctor. This is important. This information is not intended to replace advice given to you by your health care provider. Make sure you discuss any  questions you have with your health care provider. Document Revised: 04/04/2021 Document Reviewed: 04/04/2021 Elsevier Patient Education  2023 [employer]  

## 2022-04-04 ENCOUNTER — Other Ambulatory Visit: Payer: Self-pay | Admitting: Nurse Practitioner

## 2022-04-04 DIAGNOSIS — E6609 Other obesity due to excess calories: Secondary | ICD-10-CM

## 2022-05-02 ENCOUNTER — Other Ambulatory Visit: Payer: Self-pay | Admitting: Nurse Practitioner

## 2022-05-02 DIAGNOSIS — E6609 Other obesity due to excess calories: Secondary | ICD-10-CM

## 2022-06-04 ENCOUNTER — Encounter: Payer: Self-pay | Admitting: Nurse Practitioner

## 2022-06-04 ENCOUNTER — Ambulatory Visit (INDEPENDENT_AMBULATORY_CARE_PROVIDER_SITE_OTHER): Payer: 59 | Admitting: Nurse Practitioner

## 2022-06-04 ENCOUNTER — Other Ambulatory Visit: Payer: Self-pay | Admitting: Nurse Practitioner

## 2022-06-04 VITALS — BP 136/89 | HR 81 | Temp 98.4°F | Ht 65.0 in | Wt 234.0 lb

## 2022-06-04 DIAGNOSIS — E6609 Other obesity due to excess calories: Secondary | ICD-10-CM

## 2022-06-04 LAB — BAYER DCA HB A1C WAIVED: HB A1C (BAYER DCA - WAIVED): 4.7 % — ABNORMAL LOW (ref 4.8–5.6)

## 2022-06-04 MED ORDER — WEGOVY 2.4 MG/0.75ML ~~LOC~~ SOAJ: 2.4000 mg | SUBCUTANEOUS | 3 refills | Status: DC

## 2022-06-04 NOTE — Assessment & Plan Note (Signed)
Patient is making good progress. Overall weight loss so far is 26 lb. Patient will continue on Wegovy and follow up in 4 months.  Labs completed today: CBC, CMP, Lipid panel, and A1c.

## 2022-06-04 NOTE — Patient Instructions (Signed)
Calorie Counting for Weight Loss Calories are units of energy. Your body needs a certain number of calories from food to keep going throughout the day. When you eat or drink more calories than your body needs, your body stores the extra calories mostly as fat. When you eat or drink fewer calories than your body needs, your body burns fat to get the energy it needs. Calorie counting means keeping track of how many calories you eat and drink each day. Calorie counting can be helpful if you need to lose weight. If you eat fewer calories than your body needs, you should lose weight. Ask your health care provider what a healthy weight is for you. For calorie counting to work, you will need to eat the right number of calories each day to lose a healthy amount of weight per week. A dietitian can help you figure out how many calories you need in a day and will suggest ways to reach your calorie goal. A healthy amount of weight to lose each week is usually 1-2 lb (0.5-0.9 kg). This usually means that your daily calorie intake should be reduced by 500-750 calories. Eating 1,200-1,500 calories a day can help most women lose weight. Eating 1,500-1,800 calories a day can help most men lose weight. What do I need to know about calorie counting? Work with your health care provider or dietitian to determine how many calories you should get each day. To meet your daily calorie goal, you will need to: Find out how many calories are in each food that you would like to eat. Try to do this before you eat. Decide how much of the food you plan to eat. Keep a food log. Do this by writing down what you ate and how many calories it had. To successfully lose weight, it is important to balance calorie counting with a healthy lifestyle that includes regular activity. Where do I find calorie information?  The number of calories in a food can be found on a Nutrition Facts label. If a food does not have a Nutrition Facts label, try  to look up the calories online or ask your dietitian for help. Remember that calories are listed per serving. If you choose to have more than one serving of a food, you will have to multiply the calories per serving by the number of servings you plan to eat. For example, the label on a package of bread might say that a serving size is 1 slice and that there are 90 calories in a serving. If you eat 1 slice, you will have eaten 90 calories. If you eat 2 slices, you will have eaten 180 calories. How do I keep a food log? After each time that you eat, record the following in your food log as soon as possible: What you ate. Be sure to include toppings, sauces, and other extras on the food. How much you ate. This can be measured in cups, ounces, or number of items. How many calories were in each food and drink. The total number of calories in the food you ate. Keep your food log near you, such as in a pocket-sized notebook or on an app or website on your mobile phone. Some programs will calculate calories for you and show you how many calories you have left to meet your daily goal. What are some portion-control tips? Know how many calories are in a serving. This will help you know how many servings you can have of a certain   food. Use a measuring cup to measure serving sizes. You could also try weighing out portions on a kitchen scale. With time, you will be able to estimate serving sizes for some foods. Take time to put servings of different foods on your favorite plates or in your favorite bowls and cups so you know what a serving looks like. Try not to eat straight from a food's packaging, such as from a bag or box. Eating straight from the package makes it hard to see how much you are eating and can lead to overeating. Put the amount you would like to eat in a cup or on a plate to make sure you are eating the right portion. Use smaller plates, glasses, and bowls for smaller portions and to prevent  overeating. Try not to multitask. For example, avoid watching TV or using your computer while eating. If it is time to eat, sit down at a table and enjoy your food. This will help you recognize when you are full. It will also help you be more mindful of what and how much you are eating. What are tips for following this plan? Reading food labels Check the calorie count compared with the serving size. The serving size may be smaller than what you are used to eating. Check the source of the calories. Try to choose foods that are high in protein, fiber, and vitamins, and low in saturated fat, trans fat, and sodium. Shopping Read nutrition labels while you shop. This will help you make healthy decisions about which foods to buy. Pay attention to nutrition labels for low-fat or fat-free foods. These foods sometimes have the same number of calories or more calories than the full-fat versions. They also often have added sugar, starch, or salt to make up for flavor that was removed with the fat. Make a grocery list of lower-calorie foods and stick to it. Cooking Try to cook your favorite foods in a healthier way. For example, try baking instead of frying. Use low-fat dairy products. Meal planning Use more fruits and vegetables. One-half of your plate should be fruits and vegetables. Include lean proteins, such as chicken, turkey, and fish. Lifestyle Each week, aim to do one of the following: 150 minutes of moderate exercise, such as walking. 75 minutes of vigorous exercise, such as running. General information Know how many calories are in the foods you eat most often. This will help you calculate calorie counts faster. Find a way of tracking calories that works for you. Get creative. Try different apps or programs if writing down calories does not work for you. What foods should I eat?  Eat nutritious foods. It is better to have a nutritious, high-calorie food, such as an avocado, than a food with  few nutrients, such as a bag of potato chips. Use your calories on foods and drinks that will fill you up and will not leave you hungry soon after eating. Examples of foods that fill you up are nuts and nut butters, vegetables, lean proteins, and high-fiber foods such as whole grains. High-fiber foods are foods with more than 5 g of fiber per serving. Pay attention to calories in drinks. Low-calorie drinks include water and unsweetened drinks. The items listed above may not be a complete list of foods and beverages you can eat. Contact a dietitian for more information. What foods should I limit? Limit foods or drinks that are not good sources of vitamins, minerals, or protein or that are high in unhealthy fats. These   include: Candy. Other sweets. Sodas, specialty coffee drinks, alcohol, and juice. The items listed above may not be a complete list of foods and beverages you should avoid. Contact a dietitian for more information. How do I count calories when eating out? Pay attention to portions. Often, portions are much larger when eating out. Try these tips to keep portions smaller: Consider sharing a meal instead of getting your own. If you get your own meal, eat only half of it. Before you start eating, ask for a container and put half of your meal into it. When available, consider ordering smaller portions from the menu instead of full portions. Pay attention to your food and drink choices. Knowing the way food is cooked and what is included with the meal can help you eat fewer calories. If calories are listed on the menu, choose the lower-calorie options. Choose dishes that include vegetables, fruits, whole grains, low-fat dairy products, and lean proteins. Choose items that are boiled, broiled, grilled, or steamed. Avoid items that are buttered, battered, fried, or served with cream sauce. Items labeled as crispy are usually fried, unless stated otherwise. Choose water, low-fat milk,  unsweetened iced tea, or other drinks without added sugar. If you want an alcoholic beverage, choose a lower-calorie option, such as a glass of wine or light beer. Ask for dressings, sauces, and syrups on the side. These are usually high in calories, so you should limit the amount you eat. If you want a salad, choose a garden salad and ask for grilled meats. Avoid extra toppings such as bacon, cheese, or fried items. Ask for the dressing on the side, or ask for olive oil and vinegar or lemon to use as dressing. Estimate how many servings of a food you are given. Knowing serving sizes will help you be aware of how much food you are eating at restaurants. Where to find more information Centers for Disease Control and Prevention: www.cdc.gov [place] Department of Agriculture: myplate.gov Summary Calorie counting means keeping track of how many calories you eat and drink each day. If you eat fewer calories than your body needs, you should lose weight. A healthy amount of weight to lose per week is usually 1-2 lb (0.5-0.9 kg). This usually means reducing your daily calorie intake by 500-750 calories. The number of calories in a food can be found on a Nutrition Facts label. If a food does not have a Nutrition Facts label, try to look up the calories online or ask your dietitian for help. Use smaller plates, glasses, and bowls for smaller portions and to prevent overeating. Use your calories on foods and drinks that will fill you up and not leave you hungry shortly after a meal. This information is not intended to replace advice given to you by your health care provider. Make sure you discuss any questions you have with your health care provider. Document Revised: 08/04/2019 Document Reviewed: 08/04/2019 Elsevier Patient Education  2023 [employer]  

## 2022-06-04 NOTE — Progress Notes (Signed)
Established Patient Office Visit  Subjective   Patient ID: Meghan Ruiz, female    DOB: 10/05/1975  Age: 46 y.o. MRN: [id number]  Chief Complaint  Patient presents with   Weight Check    HPI  Patient is following up for obesity and weight loss. So far patient is tolerating medication well with no side effect, patient progressively losing weight and following up as directed.    Patient Active Problem List   Diagnosis Date Noted   Encounter to establish care with new doctor 04/14/2019   Encounter for gynecological examination with Papanicolaou smear of cervix 08/13/2018   Surveillance for Depo-Provera contraception 08/13/2018   Negative pregnancy test 08/13/2018   Screening for colorectal cancer 08/13/2018   Encounter for surveillance of injectable contraceptive 08/10/2017   Well woman exam with routine gynecological exam 08/06/2016   Abdominal bloating 07/11/2015   Weight gain 06/14/2014   Foot pain, bilateral 06/08/2013   Obesity 06/08/2013   Contraceptive management 06/08/2013   Past Medical History:  Diagnosis Date   Abdominal bloating 07/11/2015   Foot pain, bilateral    has heel spurs   Medical history non-contributory    Obesity 06/08/2013   Weight gain 06/14/2014   Past Surgical History:  Procedure Laterality Date   APPENDECTOMY     DILATATION AND CURETTAGE/HYSTEROSCOPY WITH MINERVA N/A 05/11/2019   Procedure: HYSTEROSCOPY;  Surgeon: Florian Buff, MD;  Location: [facility];  Service: Gynecology;  Laterality: N/A;   LAPAROSCOPIC BILATERAL SALPINGECTOMY Bilateral 05/11/2019   Procedure: LAPAROSCOPIC BILATERAL SALPINGECTOMY;  Surgeon: Florian Buff, MD;  Location: [facility];  Service: Gynecology;  Laterality: Bilateral;   PILONIDAL CYST EXCISION     TONSILLECTOMY     TUBAL LIGATION  2019   Social History   Tobacco Use   Smoking status: Never   Smokeless tobacco: Never  Vaping Use   Vaping Use: Never used  Substance Use Topics   Alcohol use: No   Drug use:  No   Social History   Socioeconomic History   Marital status: Married    Spouse name: Roselie Awkward   Number of children: 3   Years of education: Not on file   Highest education level: Not on file  Occupational History   Not on file  Tobacco Use   Smoking status: Never   Smokeless tobacco: Never  Vaping Use   Vaping Use: Never used  Substance and Sexual Activity   Alcohol use: No   Drug use: No   Sexual activity: Yes    Birth control/protection: Surgical    Comment: tubal  Other Topics Concern   Not on file  Social History Narrative   Not on file   Social Determinants of Health   Financial Resource Strain: Not on file  Food Insecurity: Not on file  Transportation Needs: Not on file  Physical Activity: Not on file  Stress: Not on file  Social Connections: Not on file  Intimate Partner Violence: Not on file   Family Status  Relation Name Status   Mother  Alive   Father  Alive   Sister  Alive   Sister  Alive   Brother  Alive   Daughter  Alive   Daughter  Alive   Son  Alive   Pat Uncle  Deceased   MGM  Deceased   MGF  Alive   PGM  Deceased   PGF  Deceased   Family History  Problem Relation Age of Onset   Obesity Mother  Cancer Mother    Heart disease Mother    Hypertension Father    Other Daughter        brachial plexis   Cancer Paternal Uncle        lymph/brain   Hepatitis C Maternal Grandmother    Diabetes Paternal Grandmother    Hypertension Paternal Grandfather    Allergies  Allergen Reactions   Shellfish Allergy Swelling      Review of Systems  Constitutional: Negative.   HENT: Negative.    Eyes: Negative.   Respiratory: Negative.    Cardiovascular: Negative.   Gastrointestinal: Negative.   Genitourinary: Negative.   Musculoskeletal: Negative.   Skin: Negative.   Neurological: Negative.   All other systems reviewed and are negative.     Objective:     BP 136/89   Pulse 81   Temp 98.4 F (36.9 C)   Ht _0  (1.651 m)   Wt  234 lb (106.1 kg)   LMP 05/15/2022 (Exact Date)   SpO2 100%   BMI 38.94 kg/m  BP Readings from Last 3 Encounters:  06/04/22 136/89  04/01/22 116/78  02/26/22 122/85   Wt Readings from Last 3 Encounters:  06/04/22 234 lb (106.1 kg)  04/01/22 242 lb 9.6 oz (110 kg)  02/26/22 249 lb (112.9 kg)      Physical Exam Vitals reviewed.  Constitutional:      Appearance: Normal appearance. She is obese.  HENT:     Head: Normocephalic.     Right Ear: External ear normal.     Left Ear: External ear normal.     Nose: Nose normal.     Mouth/Throat:     Mouth: Mucous membranes are moist.     Pharynx: Oropharynx is clear.  Eyes:     Conjunctiva/sclera: Conjunctivae normal.     Pupils: Pupils are equal, round, and reactive to light.  Cardiovascular:     Rate and Rhythm: Normal rate and regular rhythm.     Pulses: Normal pulses.     Heart sounds: Normal heart sounds.  Pulmonary:     Effort: Pulmonary effort is normal.     Breath sounds: Normal breath sounds.  Abdominal:     General: Bowel sounds are normal.  Skin:    General: Skin is warm.     Findings: No erythema or rash.  Neurological:     General: No focal deficit present.     Mental Status: She is alert and oriented to person, place, and time.  Psychiatric:        Mood and Affect: Mood normal.        Behavior: Behavior normal.      Results for orders placed or performed in visit on 06/04/22  Bayer DCA Hb A1c Waived  Result Value Ref Range   HB A1C (BAYER DCA - WAIVED) 4.7 (L) 4.8 - 5.6 %    Last CBC Lab Results  Component Value Date   WBC 5.5 02/26/2022   HGB 14.8 02/26/2022   HCT 44.9 02/26/2022   MCV 87 02/26/2022   MCH 28.5 02/26/2022   RDW 12.3 02/26/2022   PLT 231 17/40/8144   Last metabolic panel Lab Results  Component Value Date   GLUCOSE 98 02/26/2022   NA 141 02/26/2022   K 4.1 02/26/2022   CL 105 02/26/2022   CO2 19 (L) 02/26/2022   BUN 8 02/26/2022   CREATININE 0.81 02/26/2022   EGFR 92  02/26/2022   CALCIUM 9.0 02/26/2022   PROT  6.3 02/26/2022   ALBUMIN 4.4 02/26/2022   LABGLOB 1.9 02/26/2022   AGRATIO 2.3 (H) 02/26/2022   BILITOT 1.9 (H) 02/26/2022   ALKPHOS 80 02/26/2022   AST 29 02/26/2022   ALT 53 (H) 02/26/2022   ANIONGAP 12 05/09/2019   Last lipids Lab Results  Component Value Date   CHOL 162 02/26/2022   HDL 45 02/26/2022   LDLCALC 105 (H) 02/26/2022   TRIG 59 02/26/2022   CHOLHDL 3.6 02/26/2022   Last hemoglobin A1c Lab Results  Component Value Date   HGBA1C 4.7 (L) 06/04/2022   Last thyroid functions Lab Results  Component Value Date   TSH 1.580 02/26/2022   Last vitamin D No results found for: "25OHVITD2", "25OHVITD3", "VD25OH" Last vitamin B12 and Folate No results found for: "VITAMINB12", "FOLATE"    The 10-year ASCVD risk score (Arnett DK, et al., 2019) is: 0.8%    Assessment & Plan:   Problem List Items Addressed This Visit       Other   Obesity - Primary    Patient is making good progress. Overall weight loss so far is 26 lb. Patient will continue on Wegovy and follow up in 4 months.  Labs completed today: CBC, CMP, Lipid panel, and A1c.      Relevant Medications   Semaglutide-Weight Management (WEGOVY) 2.4 MG/0.75ML SOAJ   Other Relevant Orders   CMP14+EGFR   CBC with Differential   Lipid Panel   Bayer DCA Hb A1c Waived (Completed)    Return in about 4 months (around 10/03/2022) for Obesity .    Ivy Lynn, NP

## 2022-06-05 LAB — CMP14+EGFR
ALT: 33 IU/L — ABNORMAL HIGH (ref 0–32)
AST: 21 IU/L (ref 0–40)
Albumin/Globulin Ratio: 2.3 — ABNORMAL HIGH (ref 1.2–2.2)
Albumin: 4.3 g/dL (ref 3.9–4.9)
Alkaline Phosphatase: 75 IU/L (ref 44–121)
BUN/Creatinine Ratio: 13 (ref 9–23)
BUN: 10 mg/dL (ref 6–24)
Bilirubin Total: 1.7 mg/dL — ABNORMAL HIGH (ref 0.0–1.2)
CO2: 22 mmol/L (ref 20–29)
Calcium: 9.2 mg/dL (ref 8.7–10.2)
Chloride: 107 mmol/L — ABNORMAL HIGH (ref 96–106)
Creatinine, Ser: 0.8 mg/dL (ref 0.57–1.00)
Globulin, Total: 1.9 g/dL (ref 1.5–4.5)
Glucose: 98 mg/dL (ref 70–99)
Potassium: 4.3 mmol/L (ref 3.5–5.2)
Sodium: 141 mmol/L (ref 134–144)
Total Protein: 6.2 g/dL (ref 6.0–8.5)
eGFR: 93 mL/min/{1.73_m2} (ref >59)

## 2022-06-05 LAB — CBC WITH DIFFERENTIAL/PLATELET
Basophils Absolute: 0.1 10*3/uL (ref 0.0–0.2)
Basos: 1 %
EOS (ABSOLUTE): 0.1 10*3/uL (ref 0.0–0.4)
Eos: 2 %
Hematocrit: 43.7 % (ref 34.0–46.6)
Hemoglobin: 14.5 g/dL (ref 11.1–15.9)
Immature Grans (Abs): 0 10*3/uL (ref 0.0–0.1)
Immature Granulocytes: 0 %
Lymphocytes Absolute: 2.7 10*3/uL (ref 0.7–3.1)
Lymphs: 41 %
MCH: 28.5 pg (ref 26.6–33.0)
MCHC: 33.2 g/dL (ref 31.5–35.7)
MCV: 86 fL (ref 79–97)
Monocytes Absolute: 0.4 10*3/uL (ref 0.1–0.9)
Monocytes: 6 %
Neutrophils Absolute: 3.3 10*3/uL (ref 1.4–7.0)
Neutrophils: 50 %
Platelets: 215 10*3/uL (ref 150–450)
RBC: 5.08 x10E6/uL (ref 3.77–5.28)
RDW: 12.2 % (ref 11.7–15.4)
WBC: 6.6 10*3/uL (ref 3.4–10.8)

## 2022-06-05 LAB — LIPID PANEL
Chol/HDL Ratio: 2.9 ratio (ref 0.0–4.4)
Cholesterol, Total: 136 mg/dL (ref 100–199)
HDL: 47 mg/dL (ref >39)
LDL Chol Calc (NIH): 75 mg/dL (ref 0–99)
Triglycerides: 67 mg/dL (ref 0–149)
VLDL Cholesterol Cal: 14 mg/dL (ref 5–40)

## 2022-07-02 ENCOUNTER — Telehealth: Payer: Self-pay

## 2022-07-02 ENCOUNTER — Other Ambulatory Visit (HOSPITAL_COMMUNITY): Payer: Self-pay

## 2022-07-02 NOTE — Telephone Encounter (Signed)
Pharmacy Patient Advocate Encounter   Received notification from [facility] that prior authorization for [facility] 2.4mg /0.30ml is required/requested.  Per Test Claim: prior Berkley Harvey is required   PA submitted on 07/02/22 to (ins) [employer] via CoverMyMeds  Key [id number]  Status is pending

## 2022-07-02 NOTE — Telephone Encounter (Signed)
Pharmacy Patient Advocate Encounter  Received notification from [employer] that the request for prior authorization for [facility] has been denied due to .    How would you like to proceed?  Please be advised appeals may take up to 5 business days to be submitted as pharmacist prepares necessary documentation.  Thank you!

## 2022-07-08 ENCOUNTER — Telehealth: Payer: Self-pay | Admitting: Nurse Practitioner

## 2022-07-08 NOTE — Telephone Encounter (Signed)
I did advise pt that it looks like the PA was denied and pt voiced understanding.

## 2022-07-10 NOTE — Telephone Encounter (Signed)
Sent in e-appeal since pt is using this med for weight-loss. Attached most recent chart notes.

## 2022-07-11 ENCOUNTER — Other Ambulatory Visit (HOSPITAL_COMMUNITY): Payer: Self-pay

## 2022-07-11 NOTE — Telephone Encounter (Signed)
Patient Advocate Encounter  Appeal for Mancel Parsons has been approved.    Effective dates: 06/11/22 through 07/12/23  Filled 07/11/22 via retail

## 2022-07-16 NOTE — Telephone Encounter (Signed)
Pt aware appeal approved.

## 2022-07-21 ENCOUNTER — Other Ambulatory Visit (HOSPITAL_COMMUNITY): Payer: Self-pay

## 2022-07-21 NOTE — Telephone Encounter (Signed)
Meghan Ruiz (KeyRonal Fear) Rx #: [id number] 2.4MG /0.75ML auto-injectors Form [employer] Electronic PA Form [phone] NCPDP) Created 19 days ago Sent to Plan 19 days ago Plan Response 19 days ago Submit Clinical Questions 19 days ago Determination Unfavorable 19 days ago eAppeal Submitted 11 days ago eAppeal Determination Favorable 10 days ago Message from Plan CaseId:[id number];Status:Denied;Review Type:Prior Auth;Appeal Information: Attention:ATTN: [place] [id number]. JXBJY,NW,[id number] MVHQI:[phone] LKG:[phone]; Important - Please read the below note on eAppeals: Please reference the denial letter for information on the rights for an appeal, rationale for the denial, and how to submit an appeal including if any information is needed to support the appeal. Note about urgent situations - Generally, an urgent situation is one which, in the opinion of the provider, the health of the patient may be in serious jeopardy or may experience pain that cannot be adequately controlled while waiting for a decision on the appeal.; CaseId:[id number];Status:Approved;Review Type:Prior Auth;Coverage Start Date:06/11/2022;Coverage End Date:07/12/2023; UYQIHK:[id number];OVFIEP:[id number];Review Type:Prior Auth;Coverage Start Date:06/11/2022;Coverage End Date:07/12/2023;

## 2022-10-08 ENCOUNTER — Ambulatory Visit (INDEPENDENT_AMBULATORY_CARE_PROVIDER_SITE_OTHER): Payer: 59 | Admitting: Family Medicine

## 2022-10-08 ENCOUNTER — Encounter: Payer: Self-pay | Admitting: *Deleted

## 2022-10-08 ENCOUNTER — Encounter: Payer: Self-pay | Admitting: Family Medicine

## 2022-10-08 ENCOUNTER — Ambulatory Visit: Payer: 59 | Admitting: Nurse Practitioner

## 2022-10-08 VITALS — BP 113/79 | HR 89 | Temp 97.6°F | Resp 20 | Ht 65.0 in | Wt 223.4 lb

## 2022-10-08 DIAGNOSIS — E6609 Other obesity due to excess calories: Secondary | ICD-10-CM

## 2022-10-08 DIAGNOSIS — Z1211 Encounter for screening for malignant neoplasm of colon: Secondary | ICD-10-CM

## 2022-10-08 DIAGNOSIS — D367 Benign neoplasm of other specified sites: Secondary | ICD-10-CM

## 2022-10-08 MED ORDER — WEGOVY 2.4 MG/0.75ML ~~LOC~~ SOAJ: 2.4000 mg | SUBCUTANEOUS | 3 refills | Status: DC

## 2022-10-08 NOTE — Progress Notes (Signed)
Established Patient Office Visit  Subjective   Patient ID: Meghan Ruiz, female    DOB: 02/03/1976  Age: 47 y.o. MRN: [id number]  Chief Complaint  Patient presents with   Follow up weight    HPI Meghan Ruiz is here for a weight management follow up. She has been on [facility] for a few months and has been doing well. She denies side effects. 265 was her starting weight. She is done a total of 42 lbs. She has been following a healthy diet and smaller portions. 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   She also has a cyst on the side of her left nose. This has been there for years and has become bothersome. Her glasses get caught on this frequently. She is interested in having this removed.     ROS As per HPI.   Objective:     BP 113/79   Pulse 89   Temp 97.6 F (36.4 C) (Oral)   Resp 20   Ht 5\' 5"  (1.651 m)   Wt 223 lb 6 oz (101.3 kg)   SpO2 97%   BMI 37.17 kg/m  Wt Readings from Last 3 Encounters:  10/08/22 223 lb 6 oz (101.3 kg)  06/04/22 234 lb (106.1 kg)  04/01/22 242 lb 9.6 oz (110 kg)      Physical Exam Vitals and nursing note reviewed.  Constitutional:      General: She is not in acute distress.    Appearance: She is not ill-appearing, toxic-appearing or diaphoretic.  Neck:     Thyroid: No thyroid mass, thyromegaly or thyroid tenderness.  Cardiovascular:     Rate and Rhythm: Normal rate and regular rhythm.     Heart sounds: Normal heart sounds. No murmur heard. Pulmonary:     Effort: Pulmonary effort is normal. No respiratory distress.     Breath sounds: Normal breath sounds.  Musculoskeletal:     Cervical back: Neck supple. No rigidity.     Right lower leg: No edema.     Left lower leg: No edema.  Skin:    General: Skin is warm and dry.     Findings: Lesion (dermoid cyst to left nose) present.  Neurological:     General: No focal deficit present.     Mental Status: She is alert and oriented to person, place, and time.  Psychiatric:         Mood and Affect: Mood normal.        Behavior: Behavior normal.      No results found for any visits on 10/08/22.    The 10-year ASCVD risk score (Arnett DK, et al., 2019) is: 0.4%    Assessment & Plan:   Cadence was seen today for follow up weight.  Diagnoses and all orders for this visit:  Class 1 obesity due to excess calories in adult, unspecified BMI, unspecified whether serious comorbidity present Patient's BMI is >30 mg/m2.  Patient's current BMI is Body mass index is 37.17 kg/m.Marland Kitchen  Patient has lost and maintained a 5% body weight loss.  Patient is currently enrolled in a healthy eating plan along with encouraged exercise.   Patient does not have a personal or family history of medullary thyroid carcinoma (MTC) or Multiple Endocrine Neoplasia syndrome type 2 (MEN 2). Doing well. Down 42 lbs in total.  -     Semaglutide-Weight Management (WEGOVY) 2.4 MG/0.75ML SOAJ; Inject 2.4 mg into the skin every 7 (seven) days.  Colon cancer screening -  Ambulatory referral to Gastroenterology  Dermoid cyst of nose -     Ambulatory referral to Plastic Surgery   Return in about 4 months (around 02/07/2023) for weight follow up.   The patient indicates understanding of these issues and agrees with the plan.   Meghan Perking, FNP

## 2022-10-26 NOTE — Progress Notes (Unsigned)
GI Office Note    Referring Provider: Gabriel Earing, FNP Primary Care Physician:  Gabriel Earing, FNP  Primary Gastroenterologist: Gerrit Friends.Rourk, MD  Chief Complaint   Chief Complaint  Patient presents with   New Patient (Initial Visit)    Pt referred for colonoscopy   History of Present Illness   Meghan Ruiz is a 47 y.o. female presenting today at the request of Gabriel Earing, FNP for screening colonoscopy.   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   On Wegovy. Has been on this regularly since August. Denies any nausea, vomiting, abdominal pain, constipation, headaches. Has lost 38 lbs since August.   Does have diarrhea with extreme temperature changes. No melena or brbpr, lack of appetite, unintentional weight loss, dysphagia, reflux.  No change in bowel habits.  Has hemorrhoids but they do not bother her currently.  Has had external hemorrhoids present since the birth of her child.  Denies any frequent need to strain or spending long periods of time on the toilet.   Current Outpatient Medications  Medication Sig Dispense Refill   miconazole (MICATIN) 2 % cream Apply 1 Application topically 2 (two) times daily. 28.35 g 0   Semaglutide-Weight Management (WEGOVY) 2.4 MG/0.75ML SOAJ Inject 2.4 mg into the skin every 7 (seven) days. 3 mL 3   No current facility-administered medications for this visit.    Past Medical History:  Diagnosis Date   Abdominal bloating 07/11/2015   Foot pain, bilateral    has heel spurs   Medical history non-contributory    Obesity 06/08/2013   Weight gain 06/14/2014    Past Surgical History:  Procedure Laterality Date   APPENDECTOMY     DILATATION AND CURETTAGE/HYSTEROSCOPY WITH MINERVA N/A 05/11/2019   Procedure: HYSTEROSCOPY;  Surgeon: [name], MD;  Location: [facility];  Service: Gynecology;  Laterality: N/A;   LAPAROSCOPIC BILATERAL SALPINGECTOMY Bilateral 05/11/2019   Procedure: LAPAROSCOPIC BILATERAL  SALPINGECTOMY;  Surgeon: [name], MD;  Location: [facility];  Service: Gynecology;  Laterality: Bilateral;   PILONIDAL CYST EXCISION     TONSILLECTOMY     TUBAL LIGATION  2019    Family History  Problem Relation Age of Onset   Obesity Mother    Cancer Mother    Heart disease Mother    Hypertension Father    Other Daughter        brachial plexis   Cancer Paternal Uncle        lymph/brain   Hepatitis C Maternal Grandmother    Diabetes Paternal Grandmother    Hypertension Paternal Grandfather     Allergies as of 10/27/2022 - Review Complete 10/27/2022  Allergen Reaction Noted   Shellfish allergy Swelling 01/12/2017    Social History   Socioeconomic History   Marital status: Married    Spouse name: Regan Rakers   Number of children: 3   Years of education: Not on file   Highest education level: Not on file  Occupational History   Not on file  Tobacco Use   Smoking status: Never   Smokeless tobacco: Never  Vaping Use   Vaping Use: Never used  Substance and Sexual Activity   Alcohol use: No   Drug use: No   Sexual activity: Yes    Birth control/protection: Surgical    Comment: tubal  Other Topics Concern   Not on file  Social History Narrative   Not on file   Social Determinants of Health   Financial Resource Strain: Not  on file  Food Insecurity: Not on file  Transportation Needs: Not on file  Physical Activity: Not on file  Stress: Not on file  Social Connections: Not on file  Intimate Partner Violence: Not on file     Review of Systems   Gen: Denies any fever, chills, fatigue, weight loss, lack of appetite.  CV: Denies chest pain, heart palpitations, peripheral edema, syncope.  Resp: Denies shortness of breath at rest or with exertion. Denies wheezing or cough.  GI: see HPI GU : Denies urinary burning, urinary frequency, urinary hesitancy MS: Denies joint pain, muscle weakness, cramps, or limitation of movement.  Derm: Denies rash, itching, dry  skin Psych: Denies depression, anxiety, memory loss, and confusion Heme: Denies bruising, bleeding, and enlarged lymph nodes.   Physical Exam   BP 121/86   Pulse 88   Temp 98.1 F (36.7 C)   Ht  (1.626 m)   Wt 225 lb 3.2 oz (102.2 kg)   LMP 10/21/2022   BMI 38.66 kg/m   General:   Alert and oriented. Pleasant and cooperative. Well-nourished and well-developed.  Head:  Normocephalic and atraumatic. Eyes:  Without icterus, sclera clear and conjunctiva pink.  Ears:  Normal auditory acuity. Mouth:  No deformity or lesions, oral mucosa pink.  Lungs:  Clear to auscultation bilaterally. No wheezes, rales, or rhonchi. No distress.  Heart:  S1, S2 present without murmurs appreciated.  Abdomen:  +BS, soft, non-tender and non-distended. No HSM noted. No guarding or rebound. No masses appreciated. Rectal: patient deferred Msk:  Symmetrical without gross deformities. Normal posture. Extremities:  Without edema. Neurologic:  Alert and  oriented x4;  grossly normal neurologically. Skin:  Intact without significant lesions or rashes. Psych:  Alert and cooperative. Normal mood and affect.   Assessment   Meghan Ruiz is a 47 y.o. female with a history of obesity (BMI 37) presenting today for evaluation prior to scheduling screening colonoscopy.  Screening for colon cancer: No alarm symptoms present.  Denies any change in bowel habits, constipation, diarrhea, melena, or BRBPR.  Actively working toward weight loss and has lost 38 pounds with [facility] since August 2023.  Denies any family history of colon cancer or colon polyps.  Does note a history of external hemorrhoids with scant rectal bleeding in the past but none currently.  Will schedule for first-ever screening colonoscopy.  Hemorrhoids: Patient reports history of external hemorrhoids after having her child.  Has intermittent pain but currently not having any symptoms.  States she is unsure if she has any internal hemorrhoids.  Patient  deferred rectal exam today.  Provided hemorrhoid banding pamphlet for consideration of treatment of possible internal hemorrhoids if identified on colonoscopy.  Advised to continue current over-the-counter regimen for her external hemorrhoids as flares occur.  PLAN   Proceed with colonoscopy with propofol by Dr. Jena Gauss in near future: the risks, benefits, and alternatives have been discussed with the patient in detail. The patient states understanding and desires to proceed. ASA 2 Hold Wegovy for 1 week prior UPT Continue otc regimen for external hemorrhoids Hemorrhoid banding pamphlet provided for consideration of treatment of possible internal hemorrhoids.  Follow up as needed post colonoscopy.    Brooke Bonito, MSN, FNP-BC, AGACNP-BC [facility]

## 2022-10-27 ENCOUNTER — Encounter: Payer: Self-pay | Admitting: Gastroenterology

## 2022-10-27 ENCOUNTER — Ambulatory Visit (INDEPENDENT_AMBULATORY_CARE_PROVIDER_SITE_OTHER): Payer: 59 | Admitting: Gastroenterology

## 2022-10-27 VITALS — BP 121/86 | HR 88 | Temp 98.1°F | Ht 64.0 in | Wt 225.2 lb

## 2022-10-27 DIAGNOSIS — K649 Unspecified hemorrhoids: Secondary | ICD-10-CM

## 2022-10-27 DIAGNOSIS — Z1211 Encounter for screening for malignant neoplasm of colon: Secondary | ICD-10-CM | POA: Diagnosis not present

## 2022-10-27 NOTE — Patient Instructions (Signed)
We are scheduling you for a colonoscopy in the near future with Dr. Jena Gauss.  You will need to hold your [facility] for 1 week prior (procedure on day 8 or after).   In regards to your hemorrhoids you may continue to use over-the-counter creams, sitz bath, Tucks pads as needed.  If you develop any internal rectal pain, bleeding, or itching you may use over-the-counter hemorrhoid cream.  Please look over the pamphlet I provided you today regarding hemorrhoid banding for internal hemorrhoids.  If you have evidence of these on colonoscopy we can set you up for this in the office.  It was a pleasure to see you today. I want to create trusting relationships with patients. If you receive a survey regarding your visit,  I greatly appreciate you taking time to fill this out on paper or through your MyChart. I value your feedback.  Brooke Bonito, MSN, FNP-BC, AGACNP-BC [facility]

## 2022-10-28 ENCOUNTER — Encounter: Payer: Self-pay | Admitting: *Deleted

## 2022-10-28 ENCOUNTER — Encounter: Payer: Self-pay | Admitting: Gastroenterology

## 2022-10-28 ENCOUNTER — Telehealth: Payer: Self-pay | Admitting: *Deleted

## 2022-10-28 DIAGNOSIS — Z1211 Encounter for screening for malignant neoplasm of colon: Secondary | ICD-10-CM

## 2022-10-28 MED ORDER — PEG 3350-KCL-NA BICARB-NACL 420 G PO SOLR: 4000.0000 mL | Freq: Once | ORAL | 0 refills | Status: AC

## 2022-10-28 NOTE — Telephone Encounter (Signed)
Spoke with pt. Scheduled for 6/4 with Dr. Jena Gauss ASA 2. Aware needs UPT prior. Also aware to hold wegovy 7 days prior. Rx for prep sent to pharmacy. Also aware will get pre-op phone call for procedure;   Per [facility] " This member is not in scope for prior-authorization/notification for the services requested. You can save the case reference ID as validation of your request."

## 2022-11-14 ENCOUNTER — Telehealth: Payer: Self-pay | Admitting: *Deleted

## 2022-11-14 ENCOUNTER — Encounter: Payer: Self-pay | Admitting: *Deleted

## 2022-11-14 NOTE — Telephone Encounter (Signed)
Pt has procedure scheduled for 12/09/22 but needed to reschedule due to being daughters graduation day. She has been rescheduled for 01/01/23 at 9 am with Dr.Rourk. New instructions sent via MyChart.

## 2022-11-19 ENCOUNTER — Other Ambulatory Visit: Payer: Self-pay | Admitting: Family Medicine

## 2022-11-19 ENCOUNTER — Encounter: Payer: Self-pay | Admitting: Plastic Surgery

## 2022-11-19 ENCOUNTER — Ambulatory Visit (INDEPENDENT_AMBULATORY_CARE_PROVIDER_SITE_OTHER): Payer: 59 | Admitting: Plastic Surgery

## 2022-11-19 VITALS — BP 128/81 | HR 81 | Ht 65.0 in | Wt 223.4 lb

## 2022-11-19 DIAGNOSIS — L989 Disorder of the skin and subcutaneous tissue, unspecified: Secondary | ICD-10-CM | POA: Diagnosis not present

## 2022-11-19 DIAGNOSIS — Z1231 Encounter for screening mammogram for malignant neoplasm of breast: Secondary | ICD-10-CM

## 2022-11-19 NOTE — Progress Notes (Signed)
Referring Provider Gabriel Earing, FNP [address] [place],  [place] [address]   CC:  Chief Complaint  Patient presents with   Skin Problem      Meghan Ruiz is an 47 y.o. female.  HPI: Meghan Ruiz is a 47 year old female who presents today for evaluation of a skin lesion on her nose.  She states the lesion has been there for 5 years but has grown slowly over that period of time.  Her most significant concern is that it interferes with wearing safety goggles.  Requesting removal.  Allergies  Allergen Reactions   Shellfish Allergy Swelling    Outpatient Encounter Medications as of 11/19/2022  Medication Sig   miconazole (MICATIN) 2 % cream Apply 1 Application topically 2 (two) times daily.   Semaglutide-Weight Management (WEGOVY) 2.4 MG/0.75ML SOAJ Inject 2.4 mg into the skin every 7 (seven) days.   No facility-administered encounter medications on file as of 11/19/2022.     Past Medical History:  Diagnosis Date   Abdominal bloating 07/11/2015   Foot pain, bilateral    has heel spurs   Medical history non-contributory    Obesity 06/08/2013   Plantar fasciitis    Weight gain 06/14/2014    Past Surgical History:  Procedure Laterality Date   APPENDECTOMY     DILATATION AND CURETTAGE/HYSTEROSCOPY WITH MINERVA N/A 05/11/2019   Procedure: HYSTEROSCOPY;  Surgeon: [name], MD;  Location: [facility];  Service: Gynecology;  Laterality: N/A;   LAPAROSCOPIC BILATERAL SALPINGECTOMY Bilateral 05/11/2019   Procedure: LAPAROSCOPIC BILATERAL SALPINGECTOMY;  Surgeon: [name], MD;  Location: [facility];  Service: Gynecology;  Laterality: Bilateral;   PILONIDAL CYST EXCISION     x2 - one on neck and one on back   TONSILLECTOMY     TUBAL LIGATION  2019    Family History  Problem Relation Age of Onset   Obesity Mother    Cancer Mother    Heart disease Mother    Hypertension Father    Hepatitis C Maternal Grandmother    Diabetes Paternal Grandmother    Hypertension Paternal  Grandfather    Other Daughter        brachial plexis   Cancer Paternal Uncle        lymph/brain   Cancer Paternal Great-grandmother        stomach or colon?   Cirrhosis Neg Hx    Colon polyps Neg Hx    Gallbladder disease Neg Hx     Social History   Social History Narrative   Not on file     Review of Systems General: Denies fevers, chills, weight loss CV: Denies chest pain, shortness of breath, palpitations Skin: There is a 5 mm raised skin lesion on the left side of the nasal dorsum.  She denies drainage erythema or pain.  Physical Exam    11/19/2022    8:46 AM 10/27/2022   10:34 AM 10/08/2022    7:52 AM  Vitals with BMI  Height 5\' 5"  5\' 4"  5\' 5"   Weight 223 lbs 6 oz 225 lbs 3 oz 223 lbs 6 oz  BMI 37.18 38.64 37.17  Systolic 128 121 604  Diastolic 81 86 79  Pulse 81 88 89    General:  No acute distress,  Alert and oriented, Non-Toxic, Normal speech and affect Integument: As noted 5 mm raised skin lesion on the left side of the dorsum of the nose.  It is unclear exactly what this is. Mammogram: Not applicable Assessment/Plan  Skin lesion: I believe this would be amenable to resection in the office.  Patient understands that there will be a scar after the procedure.  She also understands that she will have sutures which will need to be removed 5 to 7 days after the surgery.  Will schedule at her request  Meghan Ruiz 11/19/2022, 9:41 AM

## 2022-12-04 ENCOUNTER — Other Ambulatory Visit (HOSPITAL_COMMUNITY): Payer: 59

## 2022-12-25 ENCOUNTER — Ambulatory Visit (INDEPENDENT_AMBULATORY_CARE_PROVIDER_SITE_OTHER): Payer: 59 | Admitting: Plastic Surgery

## 2022-12-25 ENCOUNTER — Encounter: Payer: Self-pay | Admitting: Plastic Surgery

## 2022-12-25 VITALS — BP 110/73 | HR 85

## 2022-12-25 DIAGNOSIS — L989 Disorder of the skin and subcutaneous tissue, unspecified: Secondary | ICD-10-CM

## 2022-12-25 DIAGNOSIS — D2339 Other benign neoplasm of skin of other parts of face: Secondary | ICD-10-CM

## 2022-12-25 NOTE — Progress Notes (Signed)
Procedure Note  Preoperative Dx: Left nasal sidewall skin lesion, 5 mm  Postoperative Dx: Same  Procedure: Excision of left nasal sidewall skin lesion  Anesthesia: Lidocaine 1% with 1:100,000 epinephrine and 0.25% Sensorcaine   Indication for Procedure: Removal for pathologic diagnosis  Description of Procedure: Risks and complications were explained to the patient including scar and need for additional surgeries.  Consent was confirmed and the patient understands the risks and benefits.  The potential complications and alternatives were explained and the patient consents.  The patient expressed understanding the option of not having the procedure and the risks of a scar.  Time out was called and all information was confirmed to be correct.    The area was prepped and drapped.  Local anesthetic was injected in the subcutaneous tissues.  After waiting for the local to take affect an elliptical incision was made around the mass excising it completely.  After obtaining hemostasis, the surgical wound was closed with interrupted 5-0 Prolene sutures.  The surgical wound measured 5 mm.  A dressing was applied.  The patient was given instructions on how to care for the area and a follow up appointment.  Meghan Ruiz tolerated the procedure well and there were no complications. The specimen was sent to pathology.

## 2022-12-30 ENCOUNTER — Other Ambulatory Visit (HOSPITAL_COMMUNITY): Payer: 59

## 2022-12-30 ENCOUNTER — Other Ambulatory Visit (HOSPITAL_COMMUNITY)
Admission: RE | Admit: 2022-12-30 | Discharge: 2022-12-30 | Disposition: A | Discharge status: Home / Self Care | Payer: 59 | Source: Ambulatory Visit | Attending: Internal Medicine | Admitting: Internal Medicine

## 2022-12-30 DIAGNOSIS — Z1211 Encounter for screening for malignant neoplasm of colon: Secondary | ICD-10-CM | POA: Insufficient documentation

## 2022-12-30 LAB — PREGNANCY, URINE: Preg Test, Ur: NEGATIVE

## 2022-12-31 ENCOUNTER — Ambulatory Visit (INDEPENDENT_AMBULATORY_CARE_PROVIDER_SITE_OTHER): Payer: 59 | Admitting: Physician Assistant

## 2022-12-31 DIAGNOSIS — L989 Disorder of the skin and subcutaneous tissue, unspecified: Secondary | ICD-10-CM

## 2022-12-31 DIAGNOSIS — D2339 Other benign neoplasm of skin of other parts of face: Secondary | ICD-10-CM

## 2022-12-31 NOTE — Progress Notes (Signed)
This is a 47 year old female seen in our office for follow-up evaluation status post excision of left nasal skin lesion.  Pathology returned showing sebaceous adenoma.  Since her procedure she denies any significant complaints or concerns.  She notes that one of the Prolene stitches did fall out.  On exam incision is clean dry and intact with 1 Prolene in place, no surrounding redness.  Overall the patient has done well, I removed the remaining Prolene.  I discussed the pathology results with Dr. Ladona Ridgel and the patient, no need for further excision.  If the patient does have any further issues or concerns she will reach out to our office.  She verbalized understanding and agreement to today's plan.

## 2023-01-01 ENCOUNTER — Ambulatory Visit (HOSPITAL_COMMUNITY): Payer: 59 | Admitting: Anesthesiology

## 2023-01-01 ENCOUNTER — Ambulatory Visit (HOSPITAL_COMMUNITY)
Admission: RE | Admit: 2023-01-01 | Discharge: 2023-01-01 | Disposition: A | Discharge status: Home / Self Care | Payer: 59 | Attending: Internal Medicine | Admitting: Internal Medicine

## 2023-01-01 ENCOUNTER — Encounter (HOSPITAL_COMMUNITY): Payer: Self-pay | Admitting: Internal Medicine

## 2023-01-01 ENCOUNTER — Other Ambulatory Visit: Payer: Self-pay

## 2023-01-01 ENCOUNTER — Encounter (HOSPITAL_COMMUNITY)
Admission: RE | Disposition: A | Discharge status: Home / Self Care | Payer: Self-pay | Source: Home / Self Care | Attending: Internal Medicine

## 2023-01-01 ENCOUNTER — Ambulatory Visit (HOSPITAL_BASED_OUTPATIENT_CLINIC_OR_DEPARTMENT_OTHER): Payer: 59 | Admitting: Anesthesiology

## 2023-01-01 DIAGNOSIS — K649 Unspecified hemorrhoids: Secondary | ICD-10-CM | POA: Diagnosis not present

## 2023-01-01 DIAGNOSIS — Z6836 Body mass index (BMI) 36.0-36.9, adult: Secondary | ICD-10-CM | POA: Diagnosis not present

## 2023-01-01 DIAGNOSIS — Z1211 Encounter for screening for malignant neoplasm of colon: Secondary | ICD-10-CM

## 2023-01-01 DIAGNOSIS — K644 Residual hemorrhoidal skin tags: Secondary | ICD-10-CM | POA: Insufficient documentation

## 2023-01-01 DIAGNOSIS — K641 Second degree hemorrhoids: Secondary | ICD-10-CM | POA: Diagnosis not present

## 2023-01-01 HISTORY — PX: COLONOSCOPY WITH PROPOFOL: SHX5780

## 2023-01-01 SURGERY — COLONOSCOPY WITH PROPOFOL
Anesthesia: General

## 2023-01-01 MED ORDER — PROPOFOL 500 MG/50ML IV EMUL
INTRAVENOUS | Status: DC | PRN
  Administered 2023-01-01: 125 ug/kg/min via INTRAVENOUS

## 2023-01-01 MED ORDER — LACTATED RINGERS IV SOLN: INTRAVENOUS | Status: DC

## 2023-01-01 MED ORDER — PROPOFOL 10 MG/ML IV BOLUS
INTRAVENOUS | Status: DC | PRN
  Administered 2023-01-01: 160 mg via INTRAVENOUS
  Administered 2023-01-01: 20 mg via INTRAVENOUS

## 2023-01-01 NOTE — Anesthesia Preprocedure Evaluation (Signed)
Anesthesia Evaluation  Patient identified by MRN, date of birth, ID band Patient awake    Reviewed: Allergy & Precautions, H&P , NPO status , Patient's Chart, lab work & pertinent test results, reviewed documented beta blocker date and time   Airway Mallampati: II  TM Distance: >3 FB Neck ROM: full    Dental no notable dental hx.    Pulmonary neg pulmonary ROS   Pulmonary exam normal breath sounds clear to auscultation       Cardiovascular Exercise Tolerance: Good negative cardio ROS  Rhythm:regular Rate:Normal     Neuro/Psych negative neurological ROS  negative psych ROS   GI/Hepatic negative GI ROS, Neg liver ROS,,,  Endo/Other    Morbid obesity  Renal/GU negative Renal ROS  negative genitourinary   Musculoskeletal   Abdominal   Peds  Hematology negative hematology ROS (+)   Anesthesia Other Findings   Reproductive/Obstetrics negative OB ROS                             Anesthesia Physical Anesthesia Plan  ASA: 3  Anesthesia Plan: General   Post-op Pain Management:    Induction:   PONV Risk Score and Plan: Propofol infusion  Airway Management Planned:   Additional Equipment:   Intra-op Plan:   Post-operative Plan:   Informed Consent: I have reviewed the patients History and Physical, chart, labs and discussed the procedure including the risks, benefits and alternatives for the proposed anesthesia with the patient or authorized representative who has indicated his/her understanding and acceptance.     Dental Advisory Given  Plan Discussed with: CRNA  Anesthesia Plan Comments:        Anesthesia Quick Evaluation

## 2023-01-01 NOTE — Anesthesia Procedure Notes (Signed)
Date/Time: 01/01/2023 9:02 AM  Performed by: Franco Nones, CRNAPre-anesthesia Checklist: Patient identified, Emergency Drugs available, Suction available, Timeout performed and Patient being monitored Patient Re-evaluated:Patient Re-evaluated prior to induction Oxygen Delivery Method: Nasal Cannula

## 2023-01-01 NOTE — H&P (Signed)
@LOGO @   Primary Care Physician:  Gabriel Earing, FNP Primary Gastroenterologist:  Dr. Jena Gauss  Pre-Procedure History & Physical: HPI:  Meghan Ruiz is a 47 y.o. female is here for a screening colonoscopy.  first ever average risk screening examination.  Past Medical History:  Diagnosis Date   Abdominal bloating 07/11/2015   Foot pain, bilateral    has heel spurs   Medical history non-contributory    Obesity 06/08/2013   Plantar fasciitis    Weight gain 06/14/2014    Past Surgical History:  Procedure Laterality Date   APPENDECTOMY     DILATATION AND CURETTAGE/HYSTEROSCOPY WITH MINERVA N/A 05/11/2019   Procedure: HYSTEROSCOPY;  Surgeon: [name], MD;  Location: [facility];  Service: Gynecology;  Laterality: N/A;   LAPAROSCOPIC BILATERAL SALPINGECTOMY Bilateral 05/11/2019   Procedure: LAPAROSCOPIC BILATERAL SALPINGECTOMY;  Surgeon: [name], MD;  Location: [facility];  Service: Gynecology;  Laterality: Bilateral;   PILONIDAL CYST EXCISION     x2 - one on neck and one on back   TONSILLECTOMY     TUBAL LIGATION  2019    Prior to Admission medications   Medication Sig Start Date End Date Taking? Authorizing Provider  Semaglutide-Weight Management (WEGOVY) 2.4 MG/0.75ML SOAJ Inject 2.4 mg into the skin every 7 (seven) days. Patient taking differently: Inject 2.4 mg into the skin every Friday. 10/08/22  Yes Gabriel Earing, FNP  polyethylene glycol-electrolytes (NULYTELY) 420 g solution Take 4,000 mLs by mouth as directed. 12/26/22   [provider]    Allergies as of 10/28/2022 - Review Complete 10/27/2022  Allergen Reaction Noted   Shellfish allergy Swelling 01/12/2017    Family History  Problem Relation Age of Onset   Obesity Mother    Cancer Mother    Heart disease Mother    Hypertension Father    Hepatitis C Maternal Grandmother    Diabetes Paternal Grandmother    Hypertension Paternal Grandfather    Other Daughter        brachial plexis   Cancer  Paternal Uncle        lymph/brain   Cancer Paternal Great-grandmother        stomach or colon?   Cirrhosis Neg Hx    Colon polyps Neg Hx    Gallbladder disease Neg Hx     Social History   Socioeconomic History   Marital status: Married    Spouse name: Regan Rakers   Number of children: 3   Years of education: Not on file   Highest education level: Not on file  Occupational History   Not on file  Tobacco Use   Smoking status: Never   Smokeless tobacco: Never  Vaping Use   Vaping Use: Never used  Substance and Sexual Activity   Alcohol use: No   Drug use: No   Sexual activity: Yes    Birth control/protection: Surgical    Comment: tubal  Other Topics Concern   Not on file  Social History Narrative   Not on file   Social Determinants of Health   Financial Resource Strain: Not on file  Food Insecurity: Not on file  Transportation Needs: Not on file  Physical Activity: Not on file  Stress: Not on file  Social Connections: Not on file  Intimate Partner Violence: Not on file    Review of Systems: See HPI, otherwise negative ROS  Physical Exam: BP 122/77   Pulse 84   Temp 98.4 F (36.9 C) (Oral)   Resp 18  Ht 5\' 5"  (1.651 m)   Wt 99.8 kg   LMP 12/06/2022   SpO2 98%   BMI 36.61 kg/m  General:   Alert,  Well-developed, well-nourished, pleasant and cooperative in NAD Neck:  Supple; no masses or thyromegaly. Lungs:  Clear throughout to auscultation.   No wheezes, crackles, or rhonchi. No acute distress. Heart:  Regular rate and rhythm; no murmurs, clicks, rubs,  or gallops. Abdomen:  Soft, nontender and nondistended. No masses, hepatosplenomegaly or hernias noted. Normal bowel sounds, without guarding, and without rebound.    Impression/Plan: Meghan Ruiz is now here to undergo a screening colonoscopy.    First ever average risk screening examination  Risks, benefits, limitations, imponderables and alternatives regarding colonoscopy have been reviewed with the  patient. Questions have been answered. All parties agreeable.     Notice:  This dictation was prepared with Dragon dictation along with smaller phrase technology. Any transcriptional errors that result from this process are unintentional and may not be corrected upon review.

## 2023-01-01 NOTE — Op Note (Signed)
[facility] Patient Name: Meghan Ruiz Procedure Date: 01/01/2023 8:43 AM MRN: [id number] Date of Birth: [birth year] Attending MD: Gennette Pac , MD, [id number] CSN: [id number] Age: 47 Admit Type: Outpatient Procedure:                Colonoscopy Indications:              Screening for colorectal malignant neoplasm Providers:                Gennette Pac, MD, Jannett Celestine, RN, Lafonda Mosses, Technician Referring MD:              Medicines:                Propofol per Anesthesia Complications:            No immediate complications. Estimated Blood Loss:     Estimated blood loss: none. Procedure:                Pre-Anesthesia Assessment:                           - Prior to the procedure, a History and Physical                            was performed, and patient medications and                            allergies were reviewed. The patient's tolerance of                            previous anesthesia was also reviewed. The risks                            and benefits of the procedure and the sedation                            options and risks were discussed with the patient.                            All questions were answered, and informed consent                            was obtained. Prior Anticoagulants: The patient has                            taken no anticoagulant or antiplatelet agents. ASA                            Grade Assessment: II - A patient with mild systemic                            disease. After reviewing the risks and benefits,  the patient was deemed in satisfactory condition to                            undergo the procedure.                           After obtaining informed consent, the colonoscope                            was passed under direct vision. Throughout the                            procedure, the patient's blood pressure, pulse, and                             oxygen saturations were monitored continuously. The                            [id number]) scope was introduced through the                            anus and advanced to the the cecum, identified by                            appendiceal orifice and ileocecal valve. The                            colonoscopy was performed without difficulty. The                            patient tolerated the procedure well. The quality                            of the bowel preparation was adequate. The                            ileocecal valve, appendiceal orifice, and rectum                            were photographed. The entire colon was well                            visualized. Scope In: 9:07:25 AM Scope Out: 9:17:41 AM Scope Withdrawal Time: 0 hours 7 minutes 41 seconds  Total Procedure Duration: 0 hours 10 minutes 16 seconds  Findings:      The perianal and digital rectal examinations were normal.      Non-bleeding external and internal hemorrhoids were found during       retroflexion. The hemorrhoids were mild, small and Grade II (internal       hemorrhoids that prolapse but reduce spontaneously).      The exam was otherwise without abnormality on direct and retroflexion       views. Impression:               - Non-bleeding external and internal hemorrhoids.                           -  The examination was otherwise normal on direct                            and retroflexion views.                           - No specimens collected. Moderate Sedation:      Moderate (conscious) sedation was personally administered by an       anesthesia professional. The following parameters were monitored: oxygen       saturation, heart rate, blood pressure, respiratory rate, EKG, adequacy       of pulmonary ventilation, and response to care. Recommendation:           - Patient has a contact number available for                            emergencies. The signs and symptoms of potential                             delayed complications were discussed with the                            patient. Return to normal activities tomorrow.                            Written discharge instructions were provided to the                            patient.                           - Resume previous diet.                           - Continue present medications.                           - Repeat colonoscopy in 10 years for screening                            purposes.                           - Return to GI office in 6 weeks. Procedure Code(s):        --- Professional ---                           [phone], Colonoscopy, flexible; diagnostic, including                            collection of specimen(s) by brushing or washing,                            when performed (separate procedure) Diagnosis Code(s):        --- Professional ---  Z12.11, Encounter for screening for malignant                            neoplasm of colon                           K64.1, Second degree hemorrhoids CPT copyright 2022 [employer]. All rights reserved. The codes documented in this report are preliminary and upon coder review may  be revised to meet current compliance requirements. Gerrit Friends. Daemian Gahm, MD Gennette Pac, MD 01/01/2023 9:24:50 AM This report has been signed electronically. Number of Addenda: 0

## 2023-01-01 NOTE — Transfer of Care (Signed)
Immediate Anesthesia Transfer of Care Note  Patient: Meghan Ruiz  Procedure(s) Performed: COLONOSCOPY WITH PROPOFOL  Patient Location: Endoscopy Unit  Anesthesia Type:General  Level of Consciousness: awake and patient cooperative  Airway & Oxygen Therapy: Patient Spontanous Breathing  Post-op Assessment: Report given to RN and Post -op Vital signs reviewed and stable  Post vital signs: Reviewed and stable  Last Vitals:  Vitals Value Taken Time  BP 119/69 01/01/23 0921  Temp 36.7 C 01/01/23 0921  Pulse 84 01/01/23 0921  Resp 20 01/01/23 0921  SpO2 97 % 01/01/23 0921    Last Pain:  Vitals:   01/01/23 0921  TempSrc: Oral  PainSc: 0-No pain      Patients Stated Pain Goal: 8 (01/01/23 0749)  Complications: No notable events documented.

## 2023-01-01 NOTE — Discharge Instructions (Addendum)
  Colonoscopy Discharge Instructions  Read the instructions outlined below and refer to this sheet in the next few weeks. These discharge instructions provide you with general information on caring for yourself after you leave the hospital. Your doctor may also give you specific instructions. While your treatment has been planned according to the most current medical practices available, unavoidable complications occasionally occur. If you have any problems or questions after discharge, call Dr. Jena Gauss at [phone]. ACTIVITY You may resume your regular activity, but move at a slower pace for the next 24 hours.  Take frequent rest periods for the next 24 hours.  Walking will help get rid of the air and reduce the bloated feeling in your belly (abdomen).  No driving for 24 hours (because of the medicine (anesthesia) used during the test).   Do not sign any important legal documents or operate any machinery for 24 hours (because of the anesthesia used during the test).  NUTRITION Drink plenty of fluids.  You may resume your normal diet as instructed by your doctor.  Begin with a light meal and progress to your normal diet. Heavy or fried foods are harder to digest and may make you feel sick to your stomach (nauseated).  Avoid alcoholic beverages for 24 hours or as instructed.  MEDICATIONS You may resume your normal medications unless your doctor tells you otherwise.  WHAT YOU CAN EXPECT TODAY Some feelings of bloating in the abdomen.  Passage of more gas than usual.  Spotting of blood in your stool or on the toilet paper.  IF YOU HAD POLYPS REMOVED DURING THE COLONOSCOPY: No aspirin products for 7 days or as instructed.  No alcohol for 7 days or as instructed.  Eat a soft diet for the next 24 hours.  FINDING OUT THE RESULTS OF YOUR TEST Not all test results are available during your visit. If your test results are not back during the visit, make an appointment with your caregiver to find out the  results. Do not assume everything is normal if you have not heard from your caregiver or the medical facility. It is important for you to follow up on all of your test results.  SEEK IMMEDIATE MEDICAL ATTENTION IF: You have more than a spotting of blood in your stool.  Your belly is swollen (abdominal distention).  You are nauseated or vomiting.  You have a temperature over 101.  You have abdominal pain or discomfort that is severe or gets worse throughout the day.     you do have small hemorrhoids.  Otherwise, your colonoscopy was normal today   it is recommended you return for repeat colonoscopy for colorectal cancer screening in 10 years   hemorrhoid information provided  Office visit with Brooke Bonito in 6 weeks   at patient request, I called Zhuri Krass at  [phone] -

## 2023-01-02 NOTE — Anesthesia Postprocedure Evaluation (Signed)
Anesthesia Post Note  Patient: Meghan Ruiz  Procedure(s) Performed: COLONOSCOPY WITH PROPOFOL  Patient location during evaluation: Phase II Anesthesia Type: General Level of consciousness: awake Pain management: pain level controlled Vital Signs Assessment: post-procedure vital signs reviewed and stable Respiratory status: spontaneous breathing and respiratory function stable Cardiovascular status: blood pressure returned to baseline and stable Postop Assessment: no headache and no apparent nausea or vomiting Anesthetic complications: no Comments: Late entry   No notable events documented.   Last Vitals:  Vitals:   01/01/23 0759 01/01/23 0921  BP: 122/77 119/69  Pulse: 84 84  Resp: 18 20  Temp: 36.9 C 36.7 C  SpO2: 98% 97%    Last Pain:  Vitals:   01/01/23 0921  TempSrc: Oral  PainSc: 0-No pain                 Windell Norfolk

## 2023-01-05 ENCOUNTER — Encounter: Payer: 59 | Admitting: Plastic Surgery

## 2023-01-07 ENCOUNTER — Encounter (HOSPITAL_COMMUNITY): Payer: Self-pay | Admitting: Internal Medicine

## 2023-01-14 ENCOUNTER — Ambulatory Visit
Admission: RE | Admit: 2023-01-14 | Discharge: 2023-01-14 | Disposition: A | Discharge status: Home / Self Care | Payer: 59 | Source: Ambulatory Visit | Attending: Family Medicine | Admitting: Family Medicine

## 2023-01-14 DIAGNOSIS — Z1231 Encounter for screening mammogram for malignant neoplasm of breast: Secondary | ICD-10-CM

## 2023-01-23 ENCOUNTER — Other Ambulatory Visit: Payer: Self-pay | Admitting: Family Medicine

## 2023-01-23 DIAGNOSIS — E6609 Other obesity due to excess calories: Secondary | ICD-10-CM

## 2023-02-06 ENCOUNTER — Ambulatory Visit (INDEPENDENT_AMBULATORY_CARE_PROVIDER_SITE_OTHER): Payer: 59 | Admitting: Family Medicine

## 2023-02-06 ENCOUNTER — Encounter: Payer: Self-pay | Admitting: Family Medicine

## 2023-02-06 VITALS — BP 103/65 | HR 86 | Temp 98.0°F | Ht 65.0 in | Wt 220.2 lb

## 2023-02-06 DIAGNOSIS — E669 Obesity, unspecified: Secondary | ICD-10-CM | POA: Diagnosis not present

## 2023-02-06 DIAGNOSIS — E7800 Pure hypercholesterolemia, unspecified: Secondary | ICD-10-CM

## 2023-02-06 LAB — CBC WITH DIFFERENTIAL/PLATELET
Basophils Absolute: 0 10*3/uL (ref 0.0–0.2)
Basos: 1 %
EOS (ABSOLUTE): 0.1 10*3/uL (ref 0.0–0.4)
Eos: 2 %
Hematocrit: 41.4 % (ref 34.0–46.6)
Hemoglobin: 13.7 g/dL (ref 11.1–15.9)
Immature Grans (Abs): 0 10*3/uL (ref 0.0–0.1)
Immature Granulocytes: 0 %
Lymphocytes Absolute: 2.4 10*3/uL (ref 0.7–3.1)
Lymphs: 41 %
MCH: 28.5 pg (ref 26.6–33.0)
MCHC: 33.1 g/dL (ref 31.5–35.7)
MCV: 86 fL (ref 79–97)
Monocytes Absolute: 0.4 10*3/uL (ref 0.1–0.9)
Monocytes: 7 %
Neutrophils Absolute: 2.8 10*3/uL (ref 1.4–7.0)
Neutrophils: 49 %
Platelets: 224 10*3/uL (ref 150–450)
RBC: 4.81 x10E6/uL (ref 3.77–5.28)
RDW: 12.3 % (ref 11.7–15.4)
WBC: 5.7 10*3/uL (ref 3.4–10.8)

## 2023-02-06 LAB — LIPID PANEL
Chol/HDL Ratio: 3.2 ratio (ref 0.0–4.4)
Cholesterol, Total: 152 mg/dL (ref 100–199)
HDL: 47 mg/dL (ref >39)
LDL Chol Calc (NIH): 92 mg/dL (ref 0–99)
Triglycerides: 66 mg/dL (ref 0–149)
VLDL Cholesterol Cal: 13 mg/dL (ref 5–40)

## 2023-02-06 LAB — CMP14+EGFR
ALT: 17 IU/L (ref 0–32)
AST: 13 IU/L (ref 0–40)
Albumin: 4.2 g/dL (ref 3.9–4.9)
Alkaline Phosphatase: 76 IU/L (ref 44–121)
BUN/Creatinine Ratio: 12 (ref 9–23)
BUN: 9 mg/dL (ref 6–24)
Bilirubin Total: 1.7 mg/dL — ABNORMAL HIGH (ref 0.0–1.2)
CO2: 22 mmol/L (ref 20–29)
Calcium: 8.8 mg/dL (ref 8.7–10.2)
Chloride: 106 mmol/L (ref 96–106)
Creatinine, Ser: 0.73 mg/dL (ref 0.57–1.00)
Globulin, Total: 2 g/dL (ref 1.5–4.5)
Glucose: 88 mg/dL (ref 70–99)
Potassium: 4.3 mmol/L (ref 3.5–5.2)
Sodium: 138 mmol/L (ref 134–144)
Total Protein: 6.2 g/dL (ref 6.0–8.5)
eGFR: 103 mL/min/{1.73_m2} (ref >59)

## 2023-02-06 LAB — TSH

## 2023-02-06 MED ORDER — TIRZEPATIDE-WEIGHT MANAGEMENT 2.5 MG/0.5ML ~~LOC~~ SOAJ
2.5000 mg | SUBCUTANEOUS | 0 refills | Status: AC
Start: 2023-02-06 — End: ?

## 2023-02-06 MED ORDER — ZEPBOUND 5 MG/0.5ML ~~LOC~~ SOAJ
5.0000 mg | SUBCUTANEOUS | 0 refills | Status: AC
Start: 2023-02-06 — End: ?

## 2023-02-06 NOTE — Progress Notes (Signed)
Established Patient Office Visit  Subjective   Patient ID: Meghan Ruiz, female    DOB: [birth year]  Age: 47 y.o. MRN: [id number]  Chief Complaint  Patient presents with   Obesity    HPI Meghan Ruiz is here for weight loss. She has been compliant with wegovy 2.4 mg weekly. Denies side effects. She eats a well balanced diet. 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 She has lost a total of 45 lbs. She has hit a plateau at 220 lbs and is frustrated by this.     ROS As per HPI.    Objective:     BP 103/65   Pulse 86   Temp 98 F (36.7 C) (Temporal)   Ht 5\' 5"  (1.651 m)   Wt 220 lb 4 oz (99.9 kg)   SpO2 99%   BMI 36.65 kg/m  Wt Readings from Last 3 Encounters:  02/06/23 220 lb 4 oz (99.9 kg)  01/01/23 220 lb (99.8 kg)  11/19/22 223 lb 6.4 oz (101.3 kg)      Physical Exam Vitals and nursing note reviewed.  Constitutional:      General: She is not in acute distress.    Appearance: She is obese. She is not ill-appearing, toxic-appearing or diaphoretic.  Neck:     Thyroid: No thyroid mass, thyromegaly or thyroid tenderness.  Cardiovascular:     Rate and Rhythm: Normal rate and regular rhythm.     Pulses: Normal pulses.     Heart sounds: Normal heart sounds. No murmur heard. Pulmonary:     Effort: Pulmonary effort is normal. No respiratory distress.     Breath sounds: Normal breath sounds.  Abdominal:     General: Bowel sounds are normal. There is no distension.     Palpations: Abdomen is soft. There is no mass.     Tenderness: There is no abdominal tenderness. There is no guarding or rebound.  Musculoskeletal:     Cervical back: Normal range of motion and neck supple. No tenderness.     Right lower leg: No edema.     Left lower leg: No edema.  Skin:    General: Skin is warm and dry.  Neurological:     General: No focal deficit present.     Mental Status: She is alert and oriented to person, place, and time.  Psychiatric:         Mood and Affect: Mood normal.        Behavior: Behavior normal.      No results found for any visits on 02/06/23.    The 10-year ASCVD risk score (Arnett DK, et al., 2019) is: 0.4%    Assessment & Plan:   Meghan Ruiz was seen today for obesity.  Diagnoses and all orders for this visit:  Obesity (BMI 30-39.9) Patient's BMI is >30 mg/m2.  Patient's current BMI is Body mass index is 36.65 kg/m.Marland Kitchen  Patient has lost and maintained a 5% body weight loss.  Patient is currently enrolled in a healthy eating plan along with encouraged exercise.   Patient does not have a personal or family history of medullary thyroid carcinoma (MTC) or Multiple Endocrine Neoplasia syndrome type 2 (MEN 2). Will try switching to zepbound to this is this will help with additional weight loss. Discussed titration dosages. Diet and exercise.  -     tirzepatide (ZEPBOUND) 2.5 MG/0.5ML Pen; Inject 2.5 mg into the skin once a week. -  tirzepatide (ZEPBOUND) 5 MG/0.5ML Pen; Inject 5 mg into the skin once a week. -     CBC with Differential/Platelet -     CMP14+EGFR -     TSH  Elevated LDL cholesterol level Fasting panel pending. Diet, exercise, weight loss.  -     Lipid panel   Return in about 3 months (around 05/09/2023) for weight .   The patient indicates understanding of these issues and agrees with the plan.  Meghan Earing, FNP

## 2023-02-09 ENCOUNTER — Telehealth: Payer: Self-pay

## 2023-02-09 ENCOUNTER — Other Ambulatory Visit: Payer: Self-pay | Admitting: Family Medicine

## 2023-02-09 ENCOUNTER — Other Ambulatory Visit (HOSPITAL_COMMUNITY): Payer: Self-pay

## 2023-02-09 NOTE — Telephone Encounter (Signed)
Pharmacy Patient Advocate Encounter  Received notification from [employer] [facility] that Prior Authorization for Zepbound 2.5MG /0.5ML pen-injectors has been APPROVED from 01/10/23 to 10/07/23. Ran test claim, Copay is $0  PA #/Case ID/Reference #: [id number]

## 2023-02-17 ENCOUNTER — Ambulatory Visit (INDEPENDENT_AMBULATORY_CARE_PROVIDER_SITE_OTHER): Payer: 59 | Admitting: Internal Medicine

## 2023-02-17 ENCOUNTER — Encounter: Payer: Self-pay | Admitting: Internal Medicine

## 2023-02-17 VITALS — BP 104/73 | HR 77 | Temp 97.9°F | Ht 65.0 in | Wt 221.2 lb

## 2023-02-17 DIAGNOSIS — K649 Unspecified hemorrhoids: Secondary | ICD-10-CM

## 2023-02-17 NOTE — Progress Notes (Unsigned)
Primary Care Physician:  Gabriel Earing, FNP Primary Gastroenterologist:  Dr. Jena Gauss  Pre-Procedure History & Physical: HPI:  Meghan Ruiz is a 47 y.o. female here for follow-up.  Her recent screening colonoscopy was normal except for internal/external hemorrhoids.  Patient knows the exam is but she does not perceive there being a problem.  She states she would not have come to the doctor for this issue.  She had a normal colonoscopy and is due for average rescreening in 10 years.  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  Tends not to drink much water.  Rarely has to strain a little to have a bowel movement rarely constipated.  Past Medical History:  Diagnosis Date   Abdominal bloating 07/11/2015   Foot pain, bilateral    has heel spurs   Medical history non-contributory    Obesity 06/08/2013   Plantar fasciitis    Weight gain 06/14/2014    Past Surgical History:  Procedure Laterality Date   APPENDECTOMY     COLONOSCOPY WITH PROPOFOL N/A 01/01/2023   Procedure: COLONOSCOPY WITH PROPOFOL;  Surgeon: Corbin Ade, MD;  Location: [facility];  Service: Endoscopy;  Laterality: N/A;  730am, asa 2   DILATATION AND CURETTAGE/HYSTEROSCOPY WITH MINERVA N/A 05/11/2019   Procedure: HYSTEROSCOPY;  Surgeon: [name], MD;  Location: [facility];  Service: Gynecology;  Laterality: N/A;   LAPAROSCOPIC BILATERAL SALPINGECTOMY Bilateral 05/11/2019   Procedure: LAPAROSCOPIC BILATERAL SALPINGECTOMY;  Surgeon: [name], MD;  Location: [facility];  Service: Gynecology;  Laterality: Bilateral;   PILONIDAL CYST EXCISION     x2 - one on neck and one on back   TONSILLECTOMY     TUBAL LIGATION  2019    Prior to Admission medications   Medication Sig Start Date End Date Taking? Authorizing Provider  tirzepatide (ZEPBOUND) 2.5 MG/0.5ML Pen Inject 2.5 mg into the skin once a week. 02/06/23  Yes Gabriel Earing, FNP  tirzepatide (ZEPBOUND) 5 MG/0.5ML Pen Inject 5 mg into the skin  once a week. 02/06/23  Yes Gabriel Earing, FNP    Allergies as of 02/17/2023 - Review Complete 02/17/2023  Allergen Reaction Noted   Shellfish allergy Swelling 01/12/2017    Family History  Problem Relation Age of Onset   Obesity Mother    Cancer Mother    Heart disease Mother    Hypertension Father    Hepatitis C Maternal Grandmother    Diabetes Paternal Grandmother    Hypertension Paternal Grandfather    Other Daughter        brachial plexis   Cancer Paternal Uncle        lymph/brain   Cancer Paternal Great-grandmother        stomach or colon?   Cirrhosis Neg Hx    Colon polyps Neg Hx    Gallbladder disease Neg Hx     Social History   Socioeconomic History   Marital status: Married    Spouse name: Regan Rakers   Number of children: 3   Years of education: Not on file   Highest education level: Not on file  Occupational History   Not on file  Tobacco Use   Smoking status: Never   Smokeless tobacco: Never  Vaping Use   Vaping status: Never Used  Substance and Sexual Activity   Alcohol use: No   Drug use: No   Sexual activity: Yes    Birth control/protection: Surgical    Comment: tubal  Other Topics Concern   Not on file  Social History Narrative   Not on file   Social Determinants of Health   Financial Resource Strain: Not on file  Food Insecurity: Not on file  Transportation Needs: Not on file  Physical Activity: Not on file  Stress: Not on file  Social Connections: Not on file  Intimate Partner Violence: Not on file    Review of Systems: See HPI, otherwise negative ROS  Physical Exam: BP 104/73 (BP Location: Right Arm, Patient Position: Sitting, Cuff Size: Large)   Pulse 77   Temp 97.9 F (36.6 C) (Oral)   Ht 5\' 5"  (1.651 m)   Wt 221 lb 3.2 oz (100.3 kg)   LMP 01/05/2023 (Approximate)   SpO2 100%   BMI 36.81 kg/m  General:   Alert,  Well-developed, well-nourished, pleasant and cooperative in NAD   Impression/Plan: 47 year old lady with  known external and internal hemorrhoids.  No other findings on recent screening colonoscopy.  She is rarely constipated and admits she does not drink much in the way of fluids daily.  She works long hours. Really she does not have symptomatic hemorrhoids.  Recommendations:  Since hemorrhoids are asymptomatic, no further treatment warranted  Adequate hydration emphasized  Screening colonoscopy in 10 years  And if any interim GI problems develop, please do not hesitate to call us.   Notice: This dictation was prepared with Dragon dictation along with smaller phrase technology. Any transcriptional errors that result from this process are unintentional and may not be corrected upon review.

## 2023-02-17 NOTE — Patient Instructions (Signed)
It was good to see you again today!  Since your hemorrhoids are not bothering you, no treatment is needed.  It is very important to stay hydrated intake in adequate fluids daily as discussed  Plan to see you back in 10 years for repeat colonoscopy  Should you develop any GI symptoms in the interim, please do not hesitate to call.

## 2023-02-26 IMAGING — MG MM DIGITAL SCREENING BILAT W/ TOMO AND CAD
8 series · 8 of 24 positions shown · non-contrast
Comparison: None.

CLINICAL DATA: Screening.

EXAM:
DIGITAL SCREENING BILATERAL MAMMOGRAM WITH TOMOSYNTHESIS AND CAD
TECHNIQUE: Bilateral screening digital craniocaudal and mediolateral oblique
mammograms were obtained. Bilateral screening digital breast
tomosynthesis was performed. The images were evaluated with
computer-aided detection.

[L MLO synth-2D]
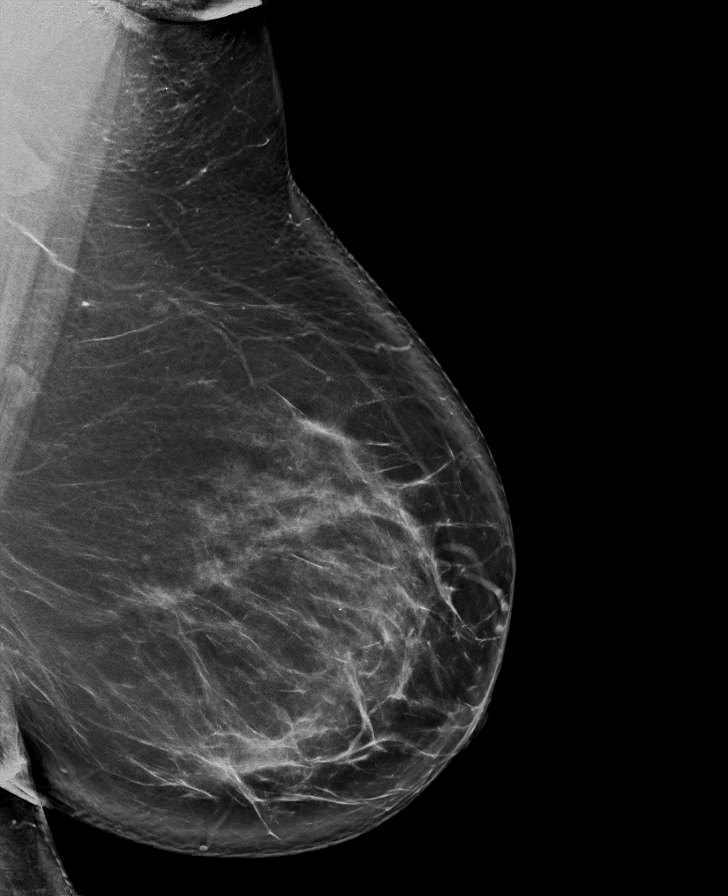

[L CC synth-2D]
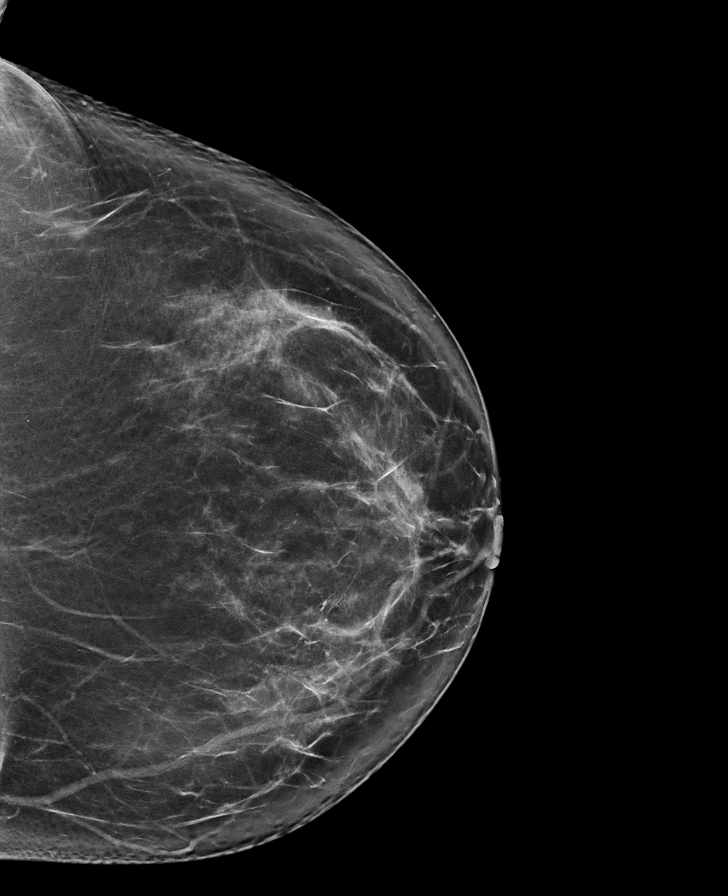

[R CC synth-2D]
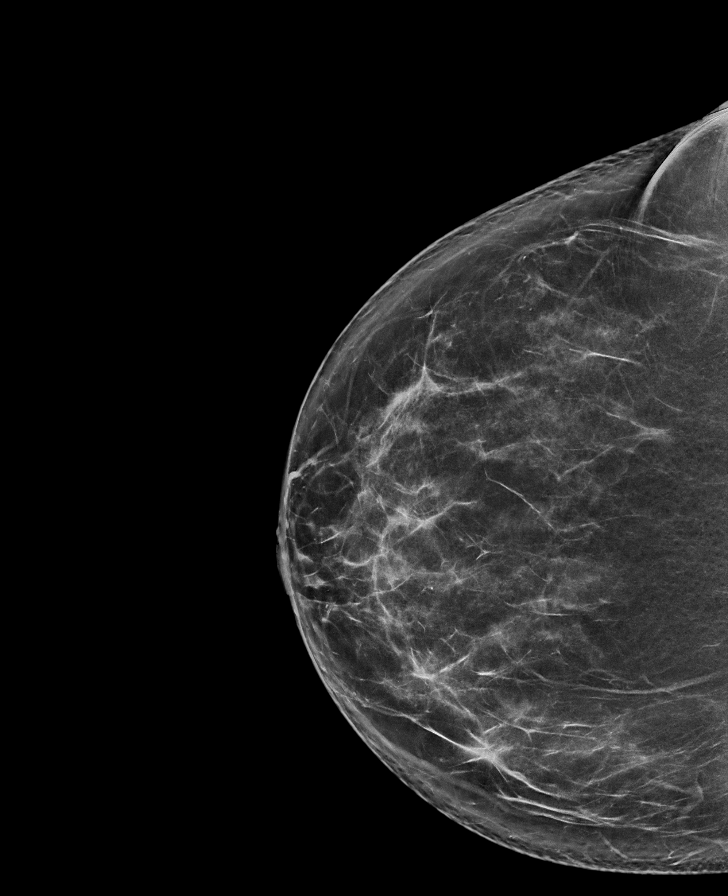

[R MLO synth-2D]
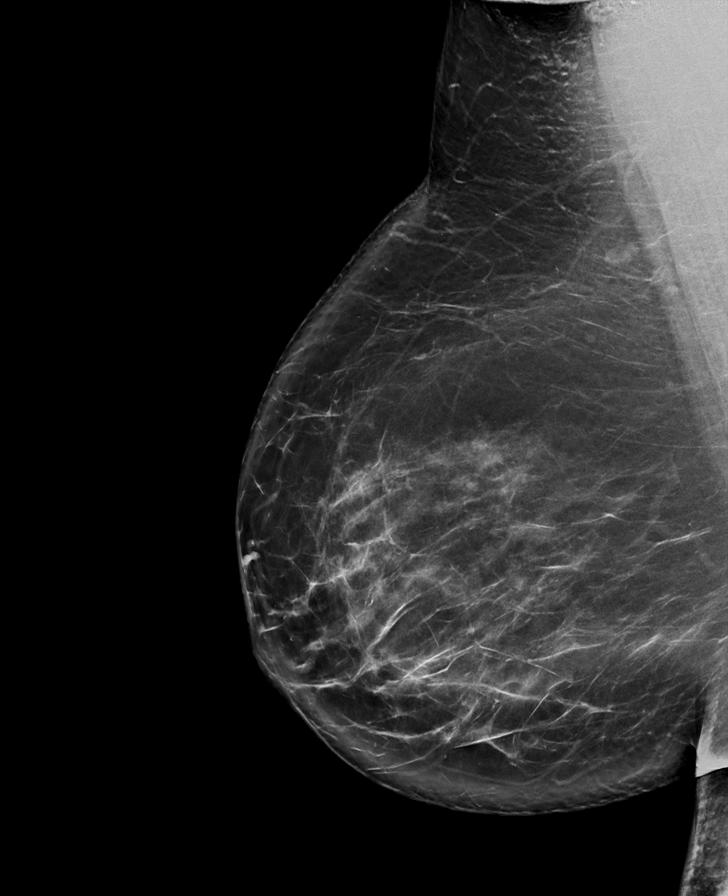

[R CC tomo · tomo slice 45/90.0]
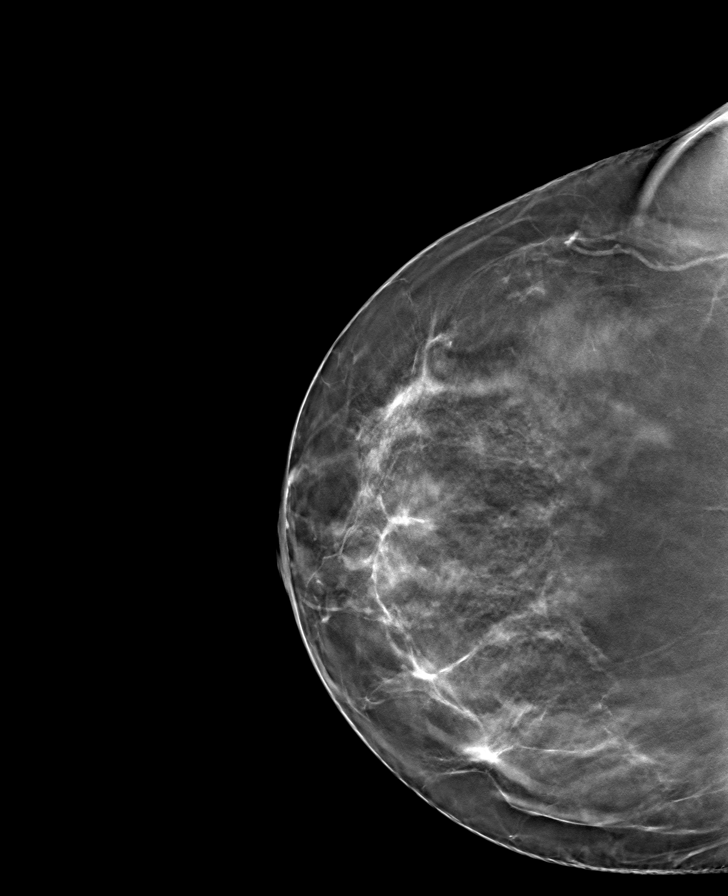

[L CC tomo · tomo slice 47/93.0]
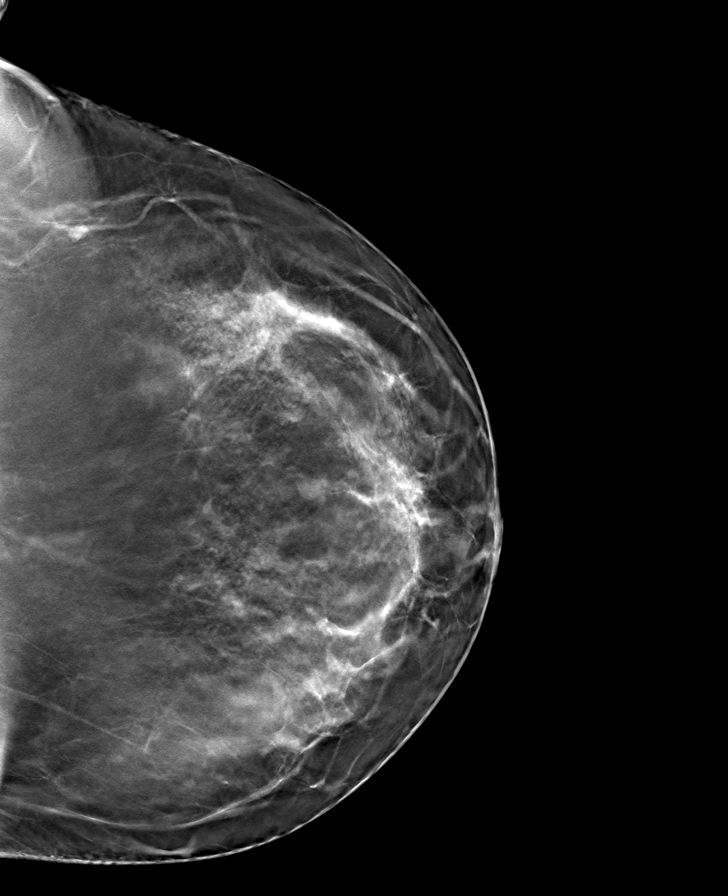

[R MLO tomo · tomo slice 51/102.0]
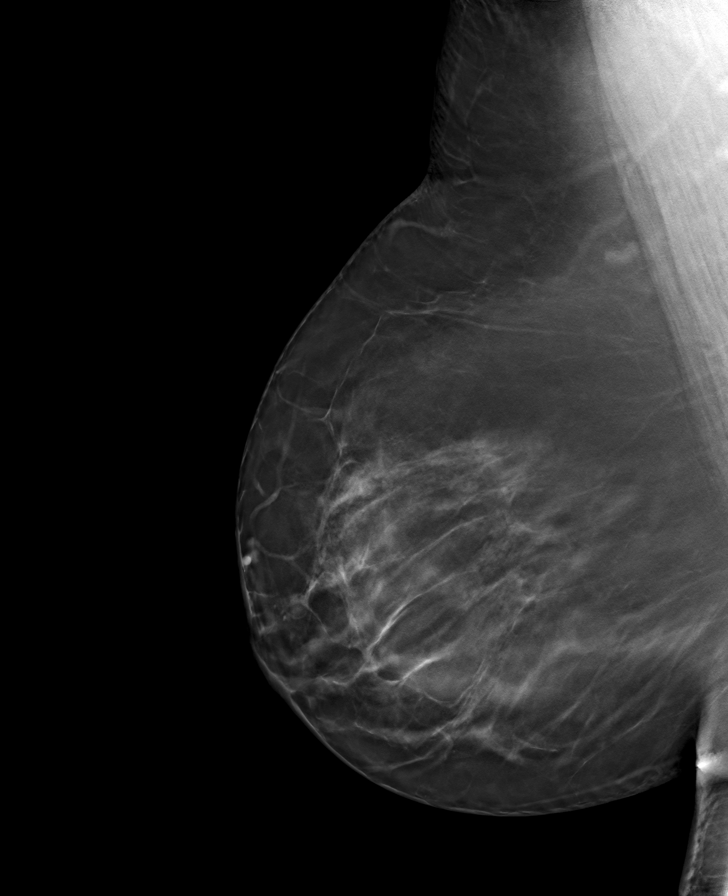

[L MLO tomo · tomo slice 54/107.0]
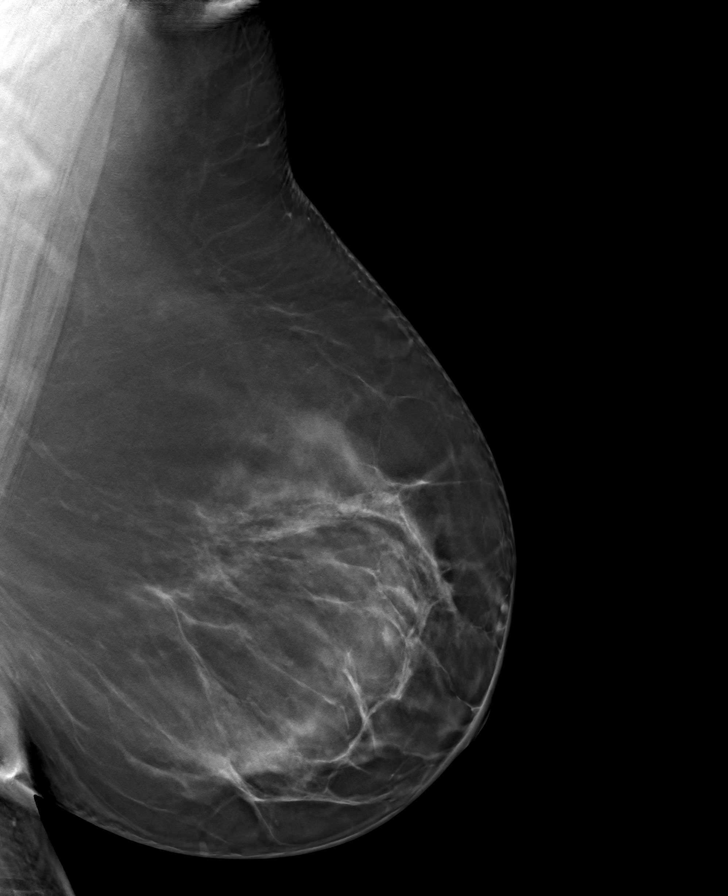

[8 of 24 positions shown; findings below may reference images not displayed]

ACR Breast Density Category b: There are scattered areas of
fibroglandular density.
FINDINGS: In the right breast a possible asymmetry requires further
evaluation.

In the left breast possible masses requires further evaluation.
IMPRESSION: Further evaluation is suggested for possible asymmetry in the right
breast.

Further evaluation is suggested for possible masses in the left
breast.

RECOMMENDATION:
Diagnostic mammogram and possibly ultrasound of both breasts.
(Code:N9-Q-000)

The patient will be contacted regarding the findings, and additional
imaging will be scheduled.

BI-RADS CATEGORY  0: Incomplete. Need additional imaging evaluation
and/or prior mammograms for comparison.

## 2023-04-07 ENCOUNTER — Telehealth: Payer: Self-pay | Admitting: Family Medicine

## 2023-04-07 DIAGNOSIS — E669 Obesity, unspecified: Secondary | ICD-10-CM

## 2023-04-07 NOTE — Telephone Encounter (Signed)
  Prescription Request  04/07/2023  Is this a "Controlled Substance" medicine? NO  Have you seen your PCP in the last 2 weeks? NO  If YES, route message to pool  -  If NO, patient needs to be scheduled for appointment.  What is the name of the medication or equipment? Tirzepatide 7.5 mg  Have you contacted your pharmacy to request a refill? yes   Which pharmacy would you [facility]   Patient notified that their request is being sent to the clinical staff for review and that they should receive a response within 2 business days.

## 2023-04-07 NOTE — Telephone Encounter (Signed)
Pt has been doing 5 mg, are we to refill this strength before her 11/8 visit or is she suppose to go to 7.5 mg Please advise

## 2023-04-09 MED ORDER — ZEPBOUND 7.5 MG/0.5ML ~~LOC~~ SOAJ: 7.5000 mg | SUBCUTANEOUS | 0 refills | Status: DC

## 2023-04-09 NOTE — Telephone Encounter (Signed)
Rx for zepbound 7.5mg  dose sent in and pt is aware.

## 2023-04-09 NOTE — Addendum Note (Signed)
Addended by: Hessie Diener on: 04/09/2023 03:33 PM   Modules accepted: Orders

## 2023-04-09 NOTE — Telephone Encounter (Signed)
Increase to 75 mg 

## 2023-05-05 NOTE — Telephone Encounter (Addendum)
Pt requesting refill on Zepbound. Needs next dosage sent to pharmacy by this Thursday so she can pick up.

## 2023-05-06 MED ORDER — ZEPBOUND 10 MG/0.5ML ~~LOC~~ SOAJ: 10.0000 mg | SUBCUTANEOUS | 0 refills | Status: DC

## 2023-05-06 NOTE — Addendum Note (Signed)
Addended by: Hessie Diener on: 05/06/2023 07:36 AM   Modules accepted: Orders

## 2023-05-06 NOTE — Telephone Encounter (Signed)
10mg  zepbound rx sent to pharmacy and pt aware.

## 2023-05-15 ENCOUNTER — Ambulatory Visit (INDEPENDENT_AMBULATORY_CARE_PROVIDER_SITE_OTHER): Payer: 59 | Admitting: Family Medicine

## 2023-05-15 ENCOUNTER — Encounter: Payer: Self-pay | Admitting: Family Medicine

## 2023-05-15 VITALS — BP 104/71 | HR 82 | Temp 97.9°F | Ht 65.0 in | Wt 219.5 lb

## 2023-05-15 DIAGNOSIS — E6609 Other obesity due to excess calories: Secondary | ICD-10-CM | POA: Diagnosis not present

## 2023-05-15 DIAGNOSIS — E66812 Obesity, class 2: Secondary | ICD-10-CM | POA: Diagnosis not present

## 2023-05-15 DIAGNOSIS — Z6836 Body mass index (BMI) 36.0-36.9, adult: Secondary | ICD-10-CM | POA: Diagnosis not present

## 2023-05-15 MED ORDER — TIRZEPATIDE-WEIGHT MANAGEMENT 12.5 MG/0.5ML ~~LOC~~ SOAJ
12.5000 mg | SUBCUTANEOUS | 6 refills | Status: DC
Start: 2023-05-15 — End: 2023-07-15

## 2023-05-15 NOTE — Progress Notes (Signed)
   Established Patient Office Visit  Subjective   Patient ID: Meghan Ruiz, female    DOB: January 21, 1976  Age: 47 y.o. MRN: [id number]  Chief Complaint  Patient presents with   Obesity    HPI Brettany is here for follow up of obesity. She has switched from [facility] to zebbound. She just picked up the 10 mg dosage to started. She reports doing well without side effects. She has lost an additional 2 pounds since her last visit, a total of 47. She was hoping for more weight loss since her last visit, but admits that diet slipped some with halloween. Overall she has been eating well and she has started doing some piliates daily.     ROS As per HPI.    Objective:     BP 104/71   Pulse 82   Temp 97.9 F (36.6 C) (Temporal)   Ht 5\' 5"  (1.651 m)   Wt 219 lb 8 oz (99.6 kg)   SpO2 100%   BMI 36.53 kg/m  Wt Readings from Last 3 Encounters:  05/15/23 219 lb 8 oz (99.6 kg)  02/17/23 221 lb 3.2 oz (100.3 kg)  02/06/23 220 lb 4 oz (99.9 kg)      Physical Exam Vitals and nursing note reviewed.  Constitutional:      General: She is not in acute distress.    Appearance: She is obese. She is not ill-appearing, toxic-appearing or diaphoretic.  Cardiovascular:     Rate and Rhythm: Normal rate and regular rhythm.     Heart sounds: Normal heart sounds. No murmur heard. Pulmonary:     Effort: Pulmonary effort is normal. No respiratory distress.     Breath sounds: Normal breath sounds. No wheezing.  Musculoskeletal:     Right lower leg: No edema.     Left lower leg: No edema.  Skin:    General: Skin is warm and dry.  Neurological:     General: No focal deficit present.     Mental Status: She is alert and oriented to person, place, and time.  Psychiatric:        Mood and Affect: Mood normal.        Behavior: Behavior normal.      No results found for any visits on 05/15/23.    The 10-year ASCVD risk score (Arnett DK, et al., 2019) is: 0.4%    Assessment & Plan:   Deiona was  seen today for obesity.  Diagnoses and all orders for this visit:  Class 2 obesity due to excess calories without serious comorbidity with body mass index (BMI) of 36.0 to 36.9 in adult Start zepbound 10 mg. After 4 weeks, increase to maintenance dose of 12.5 mg weekly. Down 47 lbs altogether. Continue diet and exercise.  -     tirzepatide (ZEPBOUND) 12.5 MG/0.5ML Pen; Inject 12.5 mg into the skin once a week.  Return in about 3 months (around 08/15/2023) for weight follow up.   The patient indicates understanding of these issues and agrees with the plan.  Gabriel Earing, FNP

## 2023-07-14 ENCOUNTER — Other Ambulatory Visit (HOSPITAL_COMMUNITY): Payer: Self-pay

## 2023-07-14 ENCOUNTER — Telehealth: Payer: Self-pay

## 2023-07-14 DIAGNOSIS — E66812 Obesity, class 2: Secondary | ICD-10-CM

## 2023-07-14 NOTE — Telephone Encounter (Signed)
 Meghan Ruiz (Key: [id number]) Rx #: [id number] Need Help? Call us  at [phone] Outcome Additional Information Required Clinical Override not needed Drug Zepbound  12.5MG /0.5ML pen-injectors ePA cloud logo Form [employer] Electronic PA Form [phone] NCPDP) Original Claim Info [id number]

## 2023-07-14 NOTE — Telephone Encounter (Signed)
 Pharmacy Patient Advocate Encounter  Received notification from [employer] [facility] that Prior Authorization for Zepbound  12.5MG /0.5ML pen-injectors has been CANCELLED due to: Clinical Override not needed   Patient needs to titrate up to the next dose. Ran test claim for Zepbound  15mg , patient's copay is $0.

## 2023-07-15 ENCOUNTER — Other Ambulatory Visit: Payer: Self-pay | Admitting: Family Medicine

## 2023-07-15 MED ORDER — ZEPBOUND 15 MG/0.5ML ~~LOC~~ SOAJ
15.0000 mg | SUBCUTANEOUS | 6 refills | Status: DC
Start: 2023-07-15 — End: 2023-12-31

## 2023-07-15 NOTE — Telephone Encounter (Signed)
 15 mg dose sent

## 2023-08-17 ENCOUNTER — Ambulatory Visit (INDEPENDENT_AMBULATORY_CARE_PROVIDER_SITE_OTHER): Payer: 59 | Admitting: Family Medicine

## 2023-08-17 ENCOUNTER — Encounter: Payer: Self-pay | Admitting: Family Medicine

## 2023-08-17 VITALS — BP 102/70 | HR 91 | Temp 97.4°F | Ht 65.0 in | Wt 210.6 lb

## 2023-08-17 DIAGNOSIS — Z6836 Body mass index (BMI) 36.0-36.9, adult: Secondary | ICD-10-CM | POA: Diagnosis not present

## 2023-08-17 DIAGNOSIS — E7800 Pure hypercholesterolemia, unspecified: Secondary | ICD-10-CM | POA: Diagnosis not present

## 2023-08-17 DIAGNOSIS — E66812 Obesity, class 2: Secondary | ICD-10-CM

## 2023-08-17 DIAGNOSIS — E6609 Other obesity due to excess calories: Secondary | ICD-10-CM | POA: Diagnosis not present

## 2023-08-17 NOTE — Progress Notes (Signed)
   Established Patient Office Visit  Subjective   Patient ID: Meghan Ruiz, female    DOB: July 03, 1976  Age: 48 y.o. MRN: [id number]  Chief Complaint  Patient presents with   Medical Management of Chronic Issues    HPI Meghan Ruiz is here for follow up of obesity. She has lost 9 lbs since her last visit 3 months ago. She has had her 2nd injection of the 15 mg dosage of zepbound . She does have diarrhea for a few days after her injections. No other side effects. She has decreased portion sizes and has been eating a well balanced diet. 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    Starting weight: 260 lbs     ROS As per HPI.    Objective:     BP 102/70   Pulse 91   Temp (!) 97.4 F (36.3 C)   Ht 5\' 5"  (1.651 m)   Wt 210 lb 9.6 oz (95.5 kg)   SpO2 100%   BMI 35.05 kg/m  Wt Readings from Last 3 Encounters:  08/17/23 210 lb 9.6 oz (95.5 kg)  05/15/23 219 lb 8 oz (99.6 kg)  02/17/23 221 lb 3.2 oz (100.3 kg)      Physical Exam Vitals and nursing note reviewed.  Constitutional:      General: She is not in acute distress.    Appearance: She is obese. She is not ill-appearing, toxic-appearing or diaphoretic.  Cardiovascular:     Rate and Rhythm: Normal rate and regular rhythm.     Heart sounds: Normal heart sounds. No murmur heard. Pulmonary:     Effort: Pulmonary effort is normal. No respiratory distress.     Breath sounds: No wheezing, rhonchi or rales.  Musculoskeletal:     Right lower leg: No edema.     Left lower leg: No edema.  Skin:    General: Skin is warm and dry.  Neurological:     General: No focal deficit present.     Mental Status: She is alert.  Psychiatric:        Mood and Affect: Mood normal.        Behavior: Behavior normal.      No results found for any visits on 08/17/23.    The 10-year ASCVD risk score (Arnett DK, et al., 2019) is: 0.4%    Assessment & Plan:   Class 2 obesity due to excess calories  without serious comorbidity with body mass index (BMI) of 36.0 to 36.9 in adult Patient's BMI is >30 mg/m2.  Patient's current BMI is Body mass index is 35.05 kg/m.Aaron Aas  Patient has lost and maintained a 5% body weight loss. She has lost 50 lbs so far. Patient is currently enrolled in a healthy eating plan along with exercise.     Elevated LDL cholesterol level Last LDL 92. Diet, exercise, weight loss.     Return in about 3 months (around 11/14/2023) for chronic follow up.   The patient indicates understanding of these issues and agrees with the plan.  Albertha Huger, FNP

## 2023-09-09 ENCOUNTER — Other Ambulatory Visit (HOSPITAL_COMMUNITY): Payer: Self-pay

## 2023-09-09 ENCOUNTER — Telehealth: Payer: Self-pay

## 2023-09-09 NOTE — Telephone Encounter (Signed)
 Pharmacy Patient Advocate Encounter  Received notification from [employer] that Prior Authorization for Zepbound 2.5MG /0.5ML pen-injectors has been APPROVED from 08/10/23 to 05/05/24   PA #/Case ID/Reference #:  [id number]

## 2023-09-09 NOTE — Telephone Encounter (Signed)
 Pharmacy Patient Advocate Encounter   Received notification from  [facility] Portal that prior authorization for Zepbound 2.5MG /0.5ML pen-injectors is required/requested.   Insurance verification completed.   The patient is insured through [employer] .   Per test claim: PA required; PA submitted to above mentioned insurance via CoverMyMeds Key/confirmation #/EOC [id number] Status is pending

## 2023-11-16 ENCOUNTER — Encounter (HOSPITAL_COMMUNITY): Payer: Self-pay

## 2023-11-23 ENCOUNTER — Encounter: Payer: Self-pay | Admitting: Family Medicine

## 2023-11-23 ENCOUNTER — Ambulatory Visit (INDEPENDENT_AMBULATORY_CARE_PROVIDER_SITE_OTHER): Payer: 59 | Admitting: Family Medicine

## 2023-11-23 VITALS — BP 118/84 | HR 86 | Temp 97.7°F | Ht 65.0 in | Wt 209.8 lb

## 2023-11-23 DIAGNOSIS — Z6836 Body mass index (BMI) 36.0-36.9, adult: Secondary | ICD-10-CM | POA: Diagnosis not present

## 2023-11-23 DIAGNOSIS — E66812 Obesity, class 2: Secondary | ICD-10-CM | POA: Diagnosis not present

## 2023-11-23 DIAGNOSIS — E7800 Pure hypercholesterolemia, unspecified: Secondary | ICD-10-CM | POA: Insufficient documentation

## 2023-11-23 DIAGNOSIS — E6609 Other obesity due to excess calories: Secondary | ICD-10-CM

## 2023-11-23 NOTE — Progress Notes (Signed)
   Established Patient Office Visit  Subjective   Patient ID: Meghan Ruiz, female    DOB: [birth year]  Age: 48 y.o. MRN: [id number]  Chief Complaint  Patient presents with   Obesity    HPI Meghan Ruiz is here for follow up of obesity. She has lost 1 lb since her last visit 3 months ago. Currently on zepbound  15 mg. She has intermittent diarrhea, otherwise no other side effects. She reports compliance with a well balanced diet the majority of the time. She does admit to snacking if bored. She has been eating lean meats and increased fruits and salads in her diet. 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 She is contemplating starting to work out at the gym with her daughter as well.    Starting weight: 260 lbs     ROS As per HPI.    Objective:     BP 118/84   Pulse 86   Temp 97.7 F (36.5 C) (Temporal)   Ht 5\' 5"  (1.651 m)   Wt 209 lb 12.8 oz (95.2 kg)   SpO2 100%   BMI 34.91 kg/m  Wt Readings from Last 3 Encounters:  11/23/23 209 lb 12.8 oz (95.2 kg)  08/17/23 210 lb 9.6 oz (95.5 kg)  05/15/23 219 lb 8 oz (99.6 kg)      Physical Exam Vitals and nursing note reviewed.  Constitutional:      General: She is not in acute distress.    Appearance: She is obese. She is not ill-appearing, toxic-appearing or diaphoretic.  Cardiovascular:     Rate and Rhythm: Normal rate and regular rhythm.     Heart sounds: Normal heart sounds. No murmur heard. Pulmonary:     Effort: Pulmonary effort is normal. No respiratory distress.     Breath sounds: No wheezing, rhonchi or rales.  Musculoskeletal:     Right lower leg: No edema.     Left lower leg: No edema.  Skin:    General: Skin is warm and dry.  Neurological:     General: No focal deficit present.     Mental Status: She is alert.  Psychiatric:        Mood and Affect: Mood normal.        Behavior: Behavior normal.      No results found for any visits on 11/23/23.    The 10-year ASCVD risk score (Arnett DK, et al., 2019) is:  0.6%    Assessment & Plan:   Meghan Ruiz was seen today for obesity.  Diagnoses and all orders for this visit:  Class 2 obesity due to excess calories without serious comorbidity with body mass index (BMI) of 36.0 to 36.9 in adult Minimal weight loss since last visit, however she has a net loss of 51 lbs. Discussed diet, exercise. Continue mounjaro .    Return in about 3 months (around 02/23/2024) for chronic follow up.   The patient indicates understanding of these issues and agrees with the plan.  Albertha Huger, FNP

## 2023-12-11 ENCOUNTER — Other Ambulatory Visit: Payer: Self-pay | Admitting: Family Medicine

## 2023-12-11 DIAGNOSIS — Z1231 Encounter for screening mammogram for malignant neoplasm of breast: Secondary | ICD-10-CM

## 2023-12-31 ENCOUNTER — Other Ambulatory Visit: Payer: Self-pay | Admitting: Family Medicine

## 2023-12-31 DIAGNOSIS — E6609 Other obesity due to excess calories: Secondary | ICD-10-CM

## 2024-01-18 ENCOUNTER — Ambulatory Visit

## 2024-02-02 ENCOUNTER — Encounter: Payer: Self-pay | Admitting: Family Medicine

## 2024-02-02 ENCOUNTER — Ambulatory Visit
Admission: RE | Admit: 2024-02-02 | Discharge: 2024-02-02 | Disposition: A | Discharge status: Home / Self Care | Source: Ambulatory Visit | Attending: Family Medicine

## 2024-02-02 DIAGNOSIS — Z1231 Encounter for screening mammogram for malignant neoplasm of breast: Secondary | ICD-10-CM

## 2024-02-24 ENCOUNTER — Ambulatory Visit (INDEPENDENT_AMBULATORY_CARE_PROVIDER_SITE_OTHER): Admitting: Family Medicine

## 2024-02-24 ENCOUNTER — Encounter: Payer: Self-pay | Admitting: Family Medicine

## 2024-02-24 VITALS — BP 103/68 | HR 81 | Temp 98.1°F | Ht 65.0 in | Wt 204.4 lb

## 2024-02-24 DIAGNOSIS — E7800 Pure hypercholesterolemia, unspecified: Secondary | ICD-10-CM

## 2024-02-24 DIAGNOSIS — Z0001 Encounter for general adult medical examination with abnormal findings: Secondary | ICD-10-CM | POA: Diagnosis not present

## 2024-02-24 DIAGNOSIS — E6609 Other obesity due to excess calories: Secondary | ICD-10-CM

## 2024-02-24 DIAGNOSIS — Z6836 Body mass index (BMI) 36.0-36.9, adult: Secondary | ICD-10-CM

## 2024-02-24 DIAGNOSIS — Z0000 Encounter for general adult medical examination without abnormal findings: Secondary | ICD-10-CM

## 2024-02-24 LAB — LIPID PANEL

## 2024-02-24 NOTE — Patient Instructions (Signed)

## 2024-02-24 NOTE — Progress Notes (Signed)
 Complete physical exam  Patient: Meghan Ruiz   DOB: [birth year]   48 y.o. Female  MRN: [id number]  Subjective:    Chief Complaint  Patient presents with   Annual Exam    Buna Cuppett is a 48 y.o. female who presents today for a complete physical exam. She reports consuming a well balanced diet. 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 She generally feels well. She reports sleeping well. She does not have additional problems to discuss today.   Continues to eat a healthy diet and stay actives. She has lost 5 lbs since her last visit. Starting weight is 260 lbs. Tolerating zepbound  15 mg well.   Most recent fall risk assessment:    02/24/2024    9:22 AM  Fall Risk   Falls in the past year? 0     Most recent depression screenings:    02/24/2024    9:22 AM 11/23/2023    8:57 AM  PHQ 2/9 Scores  PHQ - 2 Score 0 0  PHQ- 9 Score 0 0    Vision:Within last year and Dental: No current dental problems and Receives regular dental care  Past Medical History:  Diagnosis Date   Abdominal bloating 07/11/2015   Foot pain, bilateral    has heel spurs   Medical history non-contributory    Obesity 06/08/2013   Plantar fasciitis    Weight gain 06/14/2014      Patient Care Team: Joesph Annabella HERO, FNP as PCP - General (Family Medicine)   Outpatient Medications Prior to Visit  Medication Sig   tirzepatide  (ZEPBOUND ) 15 MG/0.5ML Pen INJECT 15MG  INTO THE SKIN ONCE A WEEK   No facility-administered medications prior to visit.    ROS Negative unless specially indicated above in HPI.     Objective:     BP 103/68   Pulse 81   Temp 98.1 F (36.7 C) (Temporal)   Ht 5' 5 (1.651 m)   Wt 204 lb 6.4 oz (92.7 kg)   SpO2 99%   BMI 34.01 kg/m  Wt Readings from Last 3 Encounters:  02/24/24 204 lb 6.4 oz (92.7 kg)  11/23/23 209 lb 12.8 oz (95.2 kg)  08/17/23 210 lb 9.6 oz (95.5 kg)    Physical Exam Vitals and nursing note reviewed.   Constitutional:      General: She is not in acute distress.    Appearance: She is obese. She is not ill-appearing, toxic-appearing or diaphoretic.  HENT:     Head: Normocephalic.     Right Ear: Tympanic membrane, ear canal and external ear normal.     Left Ear: Tympanic membrane, ear canal and external ear normal.     Nose: Nose normal.     Mouth/Throat:     Mouth: Mucous membranes are moist.     Pharynx: Oropharynx is clear.  Eyes:     Extraocular Movements: Extraocular movements intact.     Conjunctiva/sclera: Conjunctivae normal.     Pupils: Pupils are equal, round, and reactive to light.  Cardiovascular:     Rate and Rhythm: Normal rate and regular rhythm.     Pulses: Normal pulses.     Heart sounds: Normal heart sounds. No murmur heard.    No friction rub. No gallop.  Pulmonary:     Effort: Pulmonary effort is normal.     Breath sounds: Normal breath sounds.  Abdominal:     General: Bowel sounds are normal. There is no  distension.     Palpations: Abdomen is soft. There is no mass.     Tenderness: There is no abdominal tenderness. There is no guarding.  Musculoskeletal:     Cervical back: Normal range of motion and neck supple. No tenderness.     Right lower leg: No edema.     Left lower leg: No edema.  Skin:    General: Skin is warm and dry.     Capillary Refill: Capillary refill takes less than 2 seconds.     Findings: No lesion or rash.  Neurological:     General: No focal deficit present.     Mental Status: She is alert and oriented to person, place, and time.     Cranial Nerves: No cranial nerve deficit.     Motor: No weakness.     Gait: Gait normal.  Psychiatric:        Mood and Affect: Mood normal.        Behavior: Behavior normal.        Thought Content: Thought content normal.        Judgment: Judgment normal.      No results found for any visits on 02/24/24.     Assessment & Plan:    Routine Health Maintenance and Physical Exam  Lacheryl was seen  today for annual exam.  Diagnoses and all orders for this visit:  Routine general medical examination at a health care facility  Class 2 obesity due to excess calories without serious comorbidity with body mass index (BMI) of 36.0 to 36.9 in adult Net weight loss of 55 lbs. Doing well with zepdound, diet, exercise. Fasting labs pending.  -     CBC with Differential/Platelet -     CMP14+EGFR -     TSH -     VITAMIN D  25 Hydroxy (Vit-D Deficiency, Fractures)  Elevated LDL cholesterol level -     Lipid panel    Immunization History  Administered Date(s) Administered   Tdap 11/12/2021    Health Maintenance  Topic Date Due   Cervical Cancer Screening (HPV/Pap Cotest)  08/14/2023   INFLUENZA VACCINE  10/04/2024 (Originally 02/05/2024)   Hepatitis B Vaccines 19-59 Average Risk (1 of 3 - 19+ 3-dose series) 02/23/2025 (Originally 08/30/1996)   HIV Screening  02/23/2025 (Originally 08/30/1992)   COVID-19 Vaccine (1) 03/11/2025 (Originally 08/30/1982)   MAMMOGRAM  02/01/2025   DTaP/Tdap/Td (2 - Td or Tdap) 11/13/2031   Colonoscopy  12/31/2032   Hepatitis C Screening  Completed   Pneumococcal Vaccine  Aged Out   HPV VACCINES  Aged Out   Meningococcal B Vaccine  Aged Out    Discussed health benefits of physical activity, and encouraged her to engage in regular exercise appropriate for her age and condition.  Problem List Items Addressed This Visit       Other   Obesity   Relevant Orders   CBC with Differential/Platelet   CMP14+EGFR   TSH   VITAMIN D  25 Hydroxy (Vit-D Deficiency, Fractures)   Elevated LDL cholesterol level   Relevant Orders   Lipid panel   Other Visit Diagnoses       Routine general medical examination at a health care facility    -  Primary      Return in about 3 months (around 05/26/2024) for medication follow up.   The patient indicates understanding of these issues and agrees with the plan.  Annabella CHRISTELLA Search, FNP

## 2024-02-25 ENCOUNTER — Ambulatory Visit: Payer: Self-pay | Admitting: Family Medicine

## 2024-02-25 DIAGNOSIS — E559 Vitamin D deficiency, unspecified: Secondary | ICD-10-CM

## 2024-02-25 LAB — CBC WITH DIFFERENTIAL/PLATELET
Basophils Absolute: 0 x10E3/uL (ref 0.0–0.2)
Basos: 1 %
EOS (ABSOLUTE): 0.1 x10E3/uL (ref 0.0–0.4)
Eos: 2 %
Hematocrit: 44.1 % (ref 34.0–46.6)
Hemoglobin: 14.1 g/dL (ref 11.1–15.9)
Immature Grans (Abs): 0 x10E3/uL (ref 0.0–0.1)
Immature Granulocytes: 0 %
Lymphocytes Absolute: 2.2 x10E3/uL (ref 0.7–3.1)
Lymphs: 38 %
MCH: 28.4 pg (ref 26.6–33.0)
MCHC: 32 g/dL (ref 31.5–35.7)
MCV: 89 fL (ref 79–97)
Monocytes Absolute: 0.4 x10E3/uL (ref 0.1–0.9)
Monocytes: 7 %
Neutrophils Absolute: 3 x10E3/uL (ref 1.4–7.0)
Neutrophils: 52 %
Platelets: 237 x10E3/uL (ref 150–450)
RBC: 4.96 x10E6/uL (ref 3.77–5.28)
RDW: 12.2 % (ref 11.7–15.4)
WBC: 5.7 x10E3/uL (ref 3.4–10.8)

## 2024-02-25 LAB — LIPID PANEL
Cholesterol, Total: 152 mg/dL (ref 100–199)
HDL: 47 mg/dL (ref >39)
LDL CALC COMMENT:: 3.2 ratio (ref 0.0–4.4)
LDL Chol Calc (NIH): 92 mg/dL (ref 0–99)
Triglycerides: 62 mg/dL (ref 0–149)
VLDL Cholesterol Cal: 13 mg/dL (ref 5–40)

## 2024-02-25 LAB — CMP14+EGFR
ALT: 10 IU/L (ref 0–32)
AST: 13 IU/L (ref 0–40)
Albumin: 4.3 g/dL (ref 3.9–4.9)
Alkaline Phosphatase: 66 IU/L (ref 44–121)
BUN/Creatinine Ratio: 11 (ref 9–23)
BUN: 9 mg/dL (ref 6–24)
Bilirubin Total: 1.9 mg/dL — AB (ref 0.0–1.2)
CO2: 22 mmol/L (ref 20–29)
Calcium: 9.4 mg/dL (ref 8.7–10.2)
Chloride: 106 mmol/L (ref 96–106)
Creatinine, Ser: 0.81 mg/dL (ref 0.57–1.00)
Globulin, Total: 1.9 g/dL (ref 1.5–4.5)
Glucose: 88 mg/dL (ref 70–99)
Potassium: 4.6 mmol/L (ref 3.5–5.2)
Sodium: 139 mmol/L (ref 134–144)
Total Protein: 6.2 g/dL (ref 6.0–8.5)
eGFR: 91 mL/min/1.73 (ref >59)

## 2024-02-25 LAB — TSH: TSH: 1.8 u[IU]/mL (ref 0.450–4.500)

## 2024-02-25 LAB — VITAMIN D 25 HYDROXY (VIT D DEFICIENCY, FRACTURES): Vit D, 25-Hydroxy: 25.8 ng/mL — AB (ref 30.0–100.0)

## 2024-02-25 MED ORDER — VITAMIN D (ERGOCALCIFEROL) 1.25 MG (50000 UNIT) PO CAPS: 50000.0000 [IU] | ORAL_CAPSULE | ORAL | 0 refills | Status: DC

## 2024-03-24 ENCOUNTER — Other Ambulatory Visit: Payer: Self-pay | Admitting: Family Medicine

## 2024-03-24 DIAGNOSIS — E66812 Obesity, class 2: Secondary | ICD-10-CM

## 2024-04-07 ENCOUNTER — Telehealth: Payer: Self-pay | Admitting: Pharmacy Technician

## 2024-04-07 ENCOUNTER — Other Ambulatory Visit (HOSPITAL_COMMUNITY): Payer: Self-pay

## 2024-04-07 NOTE — Telephone Encounter (Signed)
 Pharmacy Patient Advocate Encounter   Received notification from CoverMyMeds that prior authorization for Zepbound  2.5MG /0.5ML pen-injectors is due for renewal.   Insurance verification completed.   The patient is insured through [employer].  Action: Medication has been discontinued. Archived Key: [id number]  **On a higher dosage now.**

## 2024-05-19 ENCOUNTER — Other Ambulatory Visit (HOSPITAL_COMMUNITY): Payer: Self-pay

## 2024-05-19 ENCOUNTER — Telehealth: Payer: Self-pay | Admitting: Pharmacy Technician

## 2024-05-19 ENCOUNTER — Other Ambulatory Visit: Payer: Self-pay | Admitting: Family Medicine

## 2024-05-19 DIAGNOSIS — E66812 Obesity, class 2: Secondary | ICD-10-CM

## 2024-05-19 NOTE — Telephone Encounter (Signed)
 Pharmacy Patient Advocate Encounter Pharmacy Patient Advocate Encounter   Received notification from Onbase that prior authorization for Zepbound  15MG /0.5ML pen-injectors is required/requested.   Insurance verification completed.   The patient is insured through [employer].   Per test claim: Insurance companies are becoming increasingly stricter about requiring thorough documentation of lifestyle modifications in the patient's chart at each visit. This includes detailed records of diet recommendations (caloric intake, etc), exercise plans (amount of time/wk, etc), and an emphasis on the patient's commitment to continuing these efforts while on medication.  Without this additional documentation in the chart notes, a prior authorization will most likely be denied.    We need weight and BMI update within the last 45 days as well per plan.  Thanks!

## 2024-05-19 NOTE — Telephone Encounter (Signed)
 Spoke with patient and made her aware. She voiced understanding and wants to wait until her next appt on 11/21 to recheck weight and BMI.

## 2024-05-20 NOTE — Telephone Encounter (Signed)
 Awesome, thank you

## 2024-05-24 ENCOUNTER — Other Ambulatory Visit (HOSPITAL_COMMUNITY): Payer: Self-pay

## 2024-05-25 ENCOUNTER — Other Ambulatory Visit (HOSPITAL_COMMUNITY): Payer: Self-pay

## 2024-05-27 ENCOUNTER — Encounter: Payer: Self-pay | Admitting: Family Medicine

## 2024-05-27 ENCOUNTER — Ambulatory Visit (INDEPENDENT_AMBULATORY_CARE_PROVIDER_SITE_OTHER): Payer: Self-pay | Admitting: Family Medicine

## 2024-05-27 DIAGNOSIS — Z6834 Body mass index (BMI) 34.0-34.9, adult: Secondary | ICD-10-CM | POA: Diagnosis not present

## 2024-05-27 NOTE — Progress Notes (Signed)
 Established Patient Office Visit  Subjective   Patient ID: Meghan Ruiz, female    DOB: Nov 27, 1975  Age: 48 y.o. MRN: [id number]  Chief Complaint  Patient presents with   Medical Management of Chronic Issues    HPI  History of Present Illness   Meghan Ruiz is a 48 year old female who presents for a weight follow-up.  She has maintained a stable weight since her last visit in August, with a total weight loss of 55 pounds since starting weight loss medication. Her weight had gotten down to 196 on her how scale. However, she experienced a slight weight gain over the past week, which she attributes to her menstrual cycle and being without a refill of her zepbound  which is requiring documentation for a PA. She is tolerating zepbound  well without any side effects.   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 During the winter months, she notes a decrease in strength and physical activity due to less outdoor work on the farm. She finds it challenging to maintain her diet during the holiday season. She is committed to continued weight loss however. She plans to focus on portion sizes and moderation with the upcoming holidays. She will ensure that she eats off a smaller plate to decrease her portion sizes at family gatherings for the holidays. She has reduced her portion sizes with her home meals and at restaurants. She focuses on lean meats, fruits, vegetables, dairy and whole grains in her diet. She is contemplating starting to track her calories to ensure a calorie deficit. She will consider apps like My Fitness Pal to assist with this. 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 She has a bowflex in the barn. She will start working out with the bowflex or with bodyweight exercising at least 3x a week for 30 mins.         ROS As  per HPI.    Objective:     BP 90/63   Pulse 75   Temp 98.6 F (37 C)   Ht 5' 5 (1.651 m)   Wt 204 lb 6.4 oz (92.7 kg)   LMP 05/24/2024 (Exact Date)   SpO2 100%   BMI 34.01 kg/m  Wt Readings from Last 3 Encounters:  05/27/24 204 lb 6.4 oz (92.7 kg)  02/24/24 204 lb 6.4 oz (92.7 kg)  11/23/23 209 lb 12.8 oz (95.2 kg)      Physical Exam Vitals and nursing note reviewed.  Constitutional:      General: She is not in acute distress.    Appearance: She is obese. She is not ill-appearing, toxic-appearing or diaphoretic.  Cardiovascular:     Rate and Rhythm: Normal rate and regular rhythm.     Heart sounds: Normal heart sounds. No murmur heard. Pulmonary:     Effort: Pulmonary effort is normal. No respiratory distress.     Breath sounds: Normal breath sounds. No wheezing, rhonchi or rales.  Musculoskeletal:     Right lower leg: No edema.     Left lower leg: No edema.  Skin:    General: Skin is warm and dry.  Neurological:     General: No focal deficit present.     Mental Status: She is alert and oriented to person, place, and time.  Psychiatric:        Mood and  Affect: Mood normal.        Behavior: Behavior normal.      No results found for any visits on 05/27/24.    The 10-year ASCVD risk score (Arnett DK, et al., 2019) is: 0.4%    Assessment & Plan:   Keiva was seen today for medical management of chronic issues.  Diagnoses and all orders for this visit:  Morbid obesity (HCC)  Assessment and Plan    Obesity BMI now 34. Starting BMI 43 (Morbid obesity) with weight of 264. Weight decreased by 55 pounds total. Stable weight since last visit. Per patient had additional weight loss since last visit but gained some weight back after a lapse in medication and her current menstrual cycle. Out of medication due to prior authorization. 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 MyFitnessPal for calorie tracking. Recommended 500 calorie deficit/day for weight loss. She  is committed to  - Sent note to PA team for medication prior authorization. - Recommended MyFitnessPal app for calorie and portion tracking. - Eat a well-balanced diet with lean meats, low fat dairy, fruits, vegetables, whole grains.  - Advised portion control strategies, e.g., small plate use. - Suggested kettlebell exercises and body weight routines. - She is committed to increasing exercise to 30 mins 3x a week along with her daily physical activity at work.  - Scheduled follow-up in six weeks for BMI monitoring.        Return in about 6 weeks (around 07/08/2024) for medication follow up.   The patient indicates understanding of these issues and agrees with the plan.  Meghan CHRISTELLA Search, FNP

## 2024-05-27 NOTE — Progress Notes (Signed)
 For PA for Zepbound 

## 2024-05-31 ENCOUNTER — Other Ambulatory Visit (HOSPITAL_BASED_OUTPATIENT_CLINIC_OR_DEPARTMENT_OTHER): Payer: Self-pay

## 2024-05-31 NOTE — Telephone Encounter (Signed)
 Pharmacy Patient Advocate Encounter  Received notification from [facility] that Prior Authorization for Zepbound  15MG /0.5ML pen-injectors has been APPROVED from 05/01/24 to 05/31/25. Ran test claim, Copay is $0.00. This test claim was processed through [facility]- copay amounts may vary at other pharmacies due to pharmacy/plan contracts, or as the patient moves through the different stages of their insurance plan.   PA #/Case ID/Reference #: [id number]

## 2024-05-31 NOTE — Telephone Encounter (Signed)
 Pharmacy Patient Advocate Encounter   Received notification from Onbase that prior authorization for Zepbound  15MG /0.5ML pen-injectors is required/requested.   Insurance verification completed.   The patient is insured through [employer].   Per test claim: PA required; PA started via CoverMyMeds. KEY [id number] . Waiting for clinical questions to populate.

## 2024-06-25 ENCOUNTER — Other Ambulatory Visit: Payer: Self-pay | Admitting: Family Medicine

## 2024-06-25 DIAGNOSIS — E6609 Other obesity due to excess calories: Secondary | ICD-10-CM

## 2024-07-08 ENCOUNTER — Ambulatory Visit (INDEPENDENT_AMBULATORY_CARE_PROVIDER_SITE_OTHER): Admitting: Family Medicine

## 2024-07-08 DIAGNOSIS — E559 Vitamin D deficiency, unspecified: Secondary | ICD-10-CM | POA: Diagnosis not present

## 2024-07-08 DIAGNOSIS — J0110 Acute frontal sinusitis, unspecified: Secondary | ICD-10-CM

## 2024-07-08 MED ORDER — AMOXICILLIN 875 MG PO TABS: 875.0000 mg | ORAL_TABLET | Freq: Two times a day (BID) | ORAL | 0 refills | Status: AC

## 2024-07-08 MED ORDER — ZEPBOUND 15 MG/0.5ML ~~LOC~~ SOAJ: 15.0000 mg | SUBCUTANEOUS | 1 refills | Status: AC

## 2024-07-08 NOTE — Patient Instructions (Signed)
 My Fitness Pal app for calorie tracking

## 2024-07-08 NOTE — Progress Notes (Signed)
 "  Established Patient Office Visit  Subjective   Patient ID: Meghan Ruiz, female    DOB: 1975/10/08  Age: 49 y.o. MRN: [id number]  Chief Complaint  Patient presents with   Medical Management of Chronic Issues    HPI  History of Present Illness   Meghan Ruiz is a 49 year old female who presents for a weight check and sinus symptoms.  Unintentional weight loss and lifestyle modification - Lost an additional 3 pounds over the past 6 weeks, totaling 58 pounds since starting current medication - Starting BMI of 43 with wegovy , then transitioned to zepbound  - Diet adherence was challenging during the holidays, but increased vegetable intake and used smaller plates for portion control - Not currently using My Fitness Pal for calorie tracking, but plans to start - Physical activity includes daily chair exercises (5-10 minutes) using the [employer]. 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  Acute sinonasal symptoms - Sinus symptoms present for approximately 1 week - Nasal congestion with clear drainage during the day and yellowish discharge in the morning - Sinus pressure and headaches localized to the forehead - No ear pain, cough, fever, or chills - DayQuil provides partial symptom relief - No prior history of frequent sinus infections; this episode is atypical - Husband has been ill since Christmas  Vitamin d  supplementation - Taking daily vitamin D  supplement (dose unknown) - Last laboratory evaluation in August showed low vitamin D  levels - Has been supplementing since that time         ROS As per HPI.   Objective:     Ht 5' 5 (1.651 m)   Wt 201 lb 12.8 oz (91.5 kg)   BMI 33.58 kg/m  Wt Readings from Last 3 Encounters:  07/08/24 201 lb 12.8 oz (91.5 kg)  05/27/24 204 lb 6.4 oz (92.7 kg)  02/24/24 204 lb 6.4 oz (92.7 kg)      Physical  Exam Vitals and nursing note reviewed.  Constitutional:      General: She is not in acute distress.    Appearance: She is obese. She is not ill-appearing, toxic-appearing or diaphoretic.  HENT:     Right Ear: A middle ear effusion is present. Tympanic membrane is not erythematous, retracted or bulging.     Left Ear:  No middle ear effusion. Tympanic membrane is not erythematous, retracted or bulging.     Nose: Congestion present.     Right Sinus: Frontal sinus tenderness present. No maxillary sinus tenderness.     Left Sinus: Frontal sinus tenderness present. No maxillary sinus tenderness.     Mouth/Throat:     Mouth: Mucous membranes are moist.     Pharynx: Oropharynx is clear.     Tonsils: No tonsillar exudate or tonsillar abscesses. 1+ on the right. 1+ on the left.  Eyes:     General:        Right eye: No discharge.        Left eye: No discharge.     Conjunctiva/sclera: Conjunctivae normal.  Cardiovascular:     Rate and Rhythm: Normal rate and regular rhythm.     Heart sounds: Normal heart sounds. No murmur heard. Pulmonary:     Effort: Pulmonary effort is normal. No respiratory distress.     Breath sounds: Normal breath sounds. No wheezing, rhonchi or rales.  Musculoskeletal:  Cervical back: Neck supple. No rigidity.     Right lower leg: No edema.     Left lower leg: No edema.  Skin:    General: Skin is warm and dry.  Neurological:     General: No focal deficit present.     Mental Status: She is alert and oriented to person, place, and time.  Psychiatric:        Mood and Affect: Mood normal.        Behavior: Behavior normal.      No results found for any visits on 07/08/24.    The 10-year ASCVD risk score (Arnett DK, et al., 2019) is: 0.4%    Assessment & Plan:   Maizee was seen today for medical management of chronic issues.  Diagnoses and all orders for this visit:  Morbid obesity (HCC) -     tirzepatide  (ZEPBOUND ) 15 MG/0.5ML Pen; Inject 15 mg into  the skin once a week.  Acute non-recurrent frontal sinusitis -     amoxicillin (AMOXIL) 875 MG tablet; Take 1 tablet (875 mg total) by mouth 2 (two) times daily for 7 days.  Vitamin D  deficiency -     Vitamin D , 25-hydroxy   Assessment and Plan    Morbid obesity Continued weight loss with 58 pounds lost. Starting BMI of 43. Discussed My Fitness Pal for calorie tracking. - Continue well balanced, low calorie diet - Increase exercise regimen to 15 mins a day with goal of 30 mins - Encouraged use of My Fitness Pal for calorie tracking. - Scheduled follow-up in six weeks for weight check.  Acute frontal sinusitis Symptoms include yellow nasal discharge, sinus pressure, and headaches. No history of frequent sinus infections. - Prescribed amoxicillin twice a day for one week. - Continue DayQuil as needed. - Advised to complete the full course of antibiotics even if symptoms improve.  Vitamin D  deficiency Previous labs showed deficiency. Currently taking vitamin D  supplement. Plan to recheck levels. - Ordered vitamin D  level test. - Continue vitamin D  supplementation. - Will adjust vitamin D  supplementation based on lab results.      Return in about 6 weeks (around 08/19/2024) for medication follow up.   The patient indicates understanding of these issues and agrees with the plan.  Meghan CHRISTELLA Search, FNP "

## 2024-07-09 LAB — VITAMIN D 25 HYDROXY (VIT D DEFICIENCY, FRACTURES): Vit D, 25-Hydroxy: 32.8 ng/mL (ref 30.0–100.0)

## 2024-07-11 ENCOUNTER — Ambulatory Visit: Payer: Self-pay | Admitting: Family Medicine

## 2024-08-19 ENCOUNTER — Ambulatory Visit: Admitting: Family Medicine
